# Patient Record
Sex: Male | Born: 1950
Health system: Southern US, Community
[De-identification: ages and names within clinical notes are randomized; demographics above are authoritative.]

## PROBLEM LIST (undated history)

## (undated) DIAGNOSIS — E079 Disorder of thyroid, unspecified: Secondary | ICD-10-CM

## (undated) DIAGNOSIS — E7800 Pure hypercholesterolemia, unspecified: Secondary | ICD-10-CM

## (undated) DIAGNOSIS — G4730 Sleep apnea, unspecified: Secondary | ICD-10-CM

## (undated) HISTORY — DX: Sleep apnea, unspecified: G47.30

---

## 1982-02-26 HISTORY — PX: NASAL POLYP SURGERY: SHX186

## 2001-10-21 ENCOUNTER — Ambulatory Visit (HOSPITAL_COMMUNITY): Admission: RE | Admit: 2001-10-21 | Discharge: 2001-10-21 | Payer: Self-pay | Admitting: Gastroenterology

## 2002-04-28 ENCOUNTER — Encounter: Payer: Self-pay | Admitting: Internal Medicine

## 2002-04-28 ENCOUNTER — Ambulatory Visit (HOSPITAL_COMMUNITY): Admission: RE | Admit: 2002-04-28 | Discharge: 2002-04-28 | Payer: Self-pay | Admitting: Internal Medicine

## 2002-05-14 ENCOUNTER — Encounter: Payer: Self-pay | Admitting: Gastroenterology

## 2002-05-14 ENCOUNTER — Encounter: Admission: RE | Admit: 2002-05-14 | Discharge: 2002-05-14 | Payer: Self-pay | Admitting: Gastroenterology

## 2004-11-13 ENCOUNTER — Ambulatory Visit (HOSPITAL_COMMUNITY): Admission: RE | Admit: 2004-11-13 | Discharge: 2004-11-13 | Payer: Self-pay | Admitting: Urology

## 2008-02-27 DIAGNOSIS — G4730 Sleep apnea, unspecified: Secondary | ICD-10-CM

## 2008-02-27 HISTORY — DX: Sleep apnea, unspecified: G47.30

## 2013-11-19 DIAGNOSIS — E7800 Pure hypercholesterolemia, unspecified: Secondary | ICD-10-CM | POA: Diagnosis not present

## 2013-11-19 DIAGNOSIS — G4733 Obstructive sleep apnea (adult) (pediatric): Secondary | ICD-10-CM | POA: Diagnosis not present

## 2013-12-14 DIAGNOSIS — E039 Hypothyroidism, unspecified: Secondary | ICD-10-CM | POA: Diagnosis not present

## 2013-12-14 DIAGNOSIS — Z0000 Encounter for general adult medical examination without abnormal findings: Secondary | ICD-10-CM | POA: Diagnosis not present

## 2013-12-14 DIAGNOSIS — R079 Chest pain, unspecified: Secondary | ICD-10-CM | POA: Diagnosis not present

## 2013-12-14 DIAGNOSIS — E785 Hyperlipidemia, unspecified: Secondary | ICD-10-CM | POA: Diagnosis not present

## 2013-12-14 DIAGNOSIS — Z125 Encounter for screening for malignant neoplasm of prostate: Secondary | ICD-10-CM | POA: Diagnosis not present

## 2013-12-14 DIAGNOSIS — Z23 Encounter for immunization: Secondary | ICD-10-CM | POA: Diagnosis not present

## 2013-12-14 DIAGNOSIS — G4733 Obstructive sleep apnea (adult) (pediatric): Secondary | ICD-10-CM | POA: Diagnosis not present

## 2013-12-22 DIAGNOSIS — I739 Peripheral vascular disease, unspecified: Secondary | ICD-10-CM | POA: Diagnosis not present

## 2013-12-22 DIAGNOSIS — I714 Abdominal aortic aneurysm, without rupture: Secondary | ICD-10-CM | POA: Diagnosis not present

## 2014-01-05 DIAGNOSIS — R0789 Other chest pain: Secondary | ICD-10-CM | POA: Diagnosis not present

## 2014-01-05 DIAGNOSIS — E782 Mixed hyperlipidemia: Secondary | ICD-10-CM | POA: Diagnosis not present

## 2014-01-18 DIAGNOSIS — R0789 Other chest pain: Secondary | ICD-10-CM | POA: Diagnosis not present

## 2014-01-18 DIAGNOSIS — E782 Mixed hyperlipidemia: Secondary | ICD-10-CM | POA: Diagnosis not present

## 2014-01-19 DIAGNOSIS — Z79899 Other long term (current) drug therapy: Secondary | ICD-10-CM | POA: Diagnosis not present

## 2014-02-02 DIAGNOSIS — R202 Paresthesia of skin: Secondary | ICD-10-CM | POA: Diagnosis not present

## 2014-02-02 DIAGNOSIS — R21 Rash and other nonspecific skin eruption: Secondary | ICD-10-CM | POA: Diagnosis not present

## 2014-02-02 DIAGNOSIS — B351 Tinea unguium: Secondary | ICD-10-CM | POA: Diagnosis not present

## 2014-03-02 DIAGNOSIS — E039 Hypothyroidism, unspecified: Secondary | ICD-10-CM | POA: Diagnosis not present

## 2014-06-17 DIAGNOSIS — Z79899 Other long term (current) drug therapy: Secondary | ICD-10-CM | POA: Diagnosis not present

## 2014-06-17 DIAGNOSIS — R7309 Other abnormal glucose: Secondary | ICD-10-CM | POA: Diagnosis not present

## 2014-06-17 DIAGNOSIS — E039 Hypothyroidism, unspecified: Secondary | ICD-10-CM | POA: Diagnosis not present

## 2014-06-17 DIAGNOSIS — E559 Vitamin D deficiency, unspecified: Secondary | ICD-10-CM | POA: Diagnosis not present

## 2014-06-17 DIAGNOSIS — E780 Pure hypercholesterolemia: Secondary | ICD-10-CM | POA: Diagnosis not present

## 2014-10-12 DIAGNOSIS — L821 Other seborrheic keratosis: Secondary | ICD-10-CM | POA: Diagnosis not present

## 2014-10-12 DIAGNOSIS — D485 Neoplasm of uncertain behavior of skin: Secondary | ICD-10-CM | POA: Diagnosis not present

## 2014-10-12 DIAGNOSIS — L570 Actinic keratosis: Secondary | ICD-10-CM | POA: Diagnosis not present

## 2014-10-12 DIAGNOSIS — D1801 Hemangioma of skin and subcutaneous tissue: Secondary | ICD-10-CM | POA: Diagnosis not present

## 2014-10-18 DIAGNOSIS — L570 Actinic keratosis: Secondary | ICD-10-CM | POA: Diagnosis not present

## 2015-01-03 DIAGNOSIS — R7309 Other abnormal glucose: Secondary | ICD-10-CM | POA: Diagnosis not present

## 2015-01-03 DIAGNOSIS — Z0000 Encounter for general adult medical examination without abnormal findings: Secondary | ICD-10-CM | POA: Diagnosis not present

## 2015-01-03 DIAGNOSIS — E785 Hyperlipidemia, unspecified: Secondary | ICD-10-CM | POA: Diagnosis not present

## 2015-01-03 DIAGNOSIS — E559 Vitamin D deficiency, unspecified: Secondary | ICD-10-CM | POA: Diagnosis not present

## 2015-01-03 DIAGNOSIS — E039 Hypothyroidism, unspecified: Secondary | ICD-10-CM | POA: Diagnosis not present

## 2015-01-10 DIAGNOSIS — E785 Hyperlipidemia, unspecified: Secondary | ICD-10-CM | POA: Diagnosis not present

## 2015-01-10 DIAGNOSIS — E039 Hypothyroidism, unspecified: Secondary | ICD-10-CM | POA: Diagnosis not present

## 2015-01-10 DIAGNOSIS — H900 Conductive hearing loss, bilateral: Secondary | ICD-10-CM | POA: Diagnosis not present

## 2015-01-10 DIAGNOSIS — Z6828 Body mass index (BMI) 28.0-28.9, adult: Secondary | ICD-10-CM | POA: Diagnosis not present

## 2015-01-10 DIAGNOSIS — Z23 Encounter for immunization: Secondary | ICD-10-CM | POA: Diagnosis not present

## 2015-01-10 DIAGNOSIS — Z0000 Encounter for general adult medical examination without abnormal findings: Secondary | ICD-10-CM | POA: Diagnosis not present

## 2015-07-13 DIAGNOSIS — R7309 Other abnormal glucose: Secondary | ICD-10-CM | POA: Diagnosis not present

## 2015-07-13 DIAGNOSIS — E785 Hyperlipidemia, unspecified: Secondary | ICD-10-CM | POA: Diagnosis not present

## 2015-07-13 DIAGNOSIS — E039 Hypothyroidism, unspecified: Secondary | ICD-10-CM | POA: Diagnosis not present

## 2015-07-13 DIAGNOSIS — M25552 Pain in left hip: Secondary | ICD-10-CM | POA: Diagnosis not present

## 2015-07-13 DIAGNOSIS — R351 Nocturia: Secondary | ICD-10-CM | POA: Diagnosis not present

## 2015-07-13 DIAGNOSIS — Z79899 Other long term (current) drug therapy: Secondary | ICD-10-CM | POA: Diagnosis not present

## 2015-10-27 DIAGNOSIS — S0093XA Contusion of unspecified part of head, initial encounter: Secondary | ICD-10-CM | POA: Diagnosis not present

## 2015-10-27 DIAGNOSIS — M25552 Pain in left hip: Secondary | ICD-10-CM | POA: Diagnosis not present

## 2015-10-27 DIAGNOSIS — S0990XA Unspecified injury of head, initial encounter: Secondary | ICD-10-CM | POA: Diagnosis not present

## 2016-02-07 DIAGNOSIS — R7309 Other abnormal glucose: Secondary | ICD-10-CM | POA: Diagnosis not present

## 2016-02-07 DIAGNOSIS — E559 Vitamin D deficiency, unspecified: Secondary | ICD-10-CM | POA: Diagnosis not present

## 2016-02-07 DIAGNOSIS — E039 Hypothyroidism, unspecified: Secondary | ICD-10-CM | POA: Diagnosis not present

## 2016-02-14 DIAGNOSIS — Z1389 Encounter for screening for other disorder: Secondary | ICD-10-CM | POA: Diagnosis not present

## 2016-02-14 DIAGNOSIS — Z0000 Encounter for general adult medical examination without abnormal findings: Secondary | ICD-10-CM | POA: Diagnosis not present

## 2016-02-14 DIAGNOSIS — H9190 Unspecified hearing loss, unspecified ear: Secondary | ICD-10-CM | POA: Diagnosis not present

## 2016-02-14 DIAGNOSIS — Z6828 Body mass index (BMI) 28.0-28.9, adult: Secondary | ICD-10-CM | POA: Diagnosis not present

## 2016-02-14 DIAGNOSIS — E781 Pure hyperglyceridemia: Secondary | ICD-10-CM | POA: Diagnosis not present

## 2016-02-14 DIAGNOSIS — Z1212 Encounter for screening for malignant neoplasm of rectum: Secondary | ICD-10-CM | POA: Diagnosis not present

## 2016-02-14 DIAGNOSIS — Z7982 Long term (current) use of aspirin: Secondary | ICD-10-CM | POA: Diagnosis not present

## 2016-04-17 DIAGNOSIS — E039 Hypothyroidism, unspecified: Secondary | ICD-10-CM | POA: Diagnosis not present

## 2016-07-30 DIAGNOSIS — E559 Vitamin D deficiency, unspecified: Secondary | ICD-10-CM | POA: Diagnosis not present

## 2016-07-30 DIAGNOSIS — E039 Hypothyroidism, unspecified: Secondary | ICD-10-CM | POA: Diagnosis not present

## 2016-07-30 DIAGNOSIS — E785 Hyperlipidemia, unspecified: Secondary | ICD-10-CM | POA: Diagnosis not present

## 2016-07-30 DIAGNOSIS — K644 Residual hemorrhoidal skin tags: Secondary | ICD-10-CM | POA: Diagnosis not present

## 2016-07-30 DIAGNOSIS — Z79899 Other long term (current) drug therapy: Secondary | ICD-10-CM | POA: Diagnosis not present

## 2016-07-30 DIAGNOSIS — R7309 Other abnormal glucose: Secondary | ICD-10-CM | POA: Diagnosis not present

## 2016-09-01 ENCOUNTER — Encounter (HOSPITAL_BASED_OUTPATIENT_CLINIC_OR_DEPARTMENT_OTHER): Payer: Self-pay | Admitting: *Deleted

## 2016-09-01 ENCOUNTER — Emergency Department (HOSPITAL_BASED_OUTPATIENT_CLINIC_OR_DEPARTMENT_OTHER)
Admission: EM | Admit: 2016-09-01 | Discharge: 2016-09-01 | Disposition: A | Discharge status: Home / Self Care | Payer: Medicare Other | Attending: Emergency Medicine | Admitting: Emergency Medicine

## 2016-09-01 ENCOUNTER — Emergency Department (HOSPITAL_BASED_OUTPATIENT_CLINIC_OR_DEPARTMENT_OTHER): Payer: Medicare Other

## 2016-09-01 DIAGNOSIS — H6522 Chronic serous otitis media, left ear: Secondary | ICD-10-CM | POA: Diagnosis not present

## 2016-09-01 DIAGNOSIS — H81392 Other peripheral vertigo, left ear: Secondary | ICD-10-CM | POA: Diagnosis not present

## 2016-09-01 DIAGNOSIS — Z7982 Long term (current) use of aspirin: Secondary | ICD-10-CM | POA: Diagnosis not present

## 2016-09-01 DIAGNOSIS — R251 Tremor, unspecified: Secondary | ICD-10-CM | POA: Diagnosis not present

## 2016-09-01 DIAGNOSIS — R42 Dizziness and giddiness: Secondary | ICD-10-CM | POA: Diagnosis not present

## 2016-09-01 DIAGNOSIS — E039 Hypothyroidism, unspecified: Secondary | ICD-10-CM | POA: Insufficient documentation

## 2016-09-01 DIAGNOSIS — H65492 Other chronic nonsuppurative otitis media, left ear: Secondary | ICD-10-CM | POA: Diagnosis not present

## 2016-09-01 DIAGNOSIS — I6789 Other cerebrovascular disease: Secondary | ICD-10-CM | POA: Diagnosis not present

## 2016-09-01 HISTORY — DX: Disorder of thyroid, unspecified: E07.9

## 2016-09-01 HISTORY — DX: Pure hypercholesterolemia, unspecified: E78.00

## 2016-09-01 LAB — BASIC METABOLIC PANEL
Anion gap: 7 (ref 5–15)
BUN: 22 mg/dL — AB (ref 6–20)
CO2: 26 mmol/L (ref 22–32)
CREATININE: 1.08 mg/dL (ref 0.61–1.24)
Calcium: 8.9 mg/dL (ref 8.9–10.3)
Chloride: 105 mmol/L (ref 101–111)
GFR calc Af Amer: >60 mL/min (ref >60)
Glucose, Bld: 149 mg/dL — ABNORMAL HIGH (ref 65–99)
POTASSIUM: 3.7 mmol/L (ref 3.5–5.1)
SODIUM: 138 mmol/L (ref 135–145)

## 2016-09-01 LAB — CBC WITH DIFFERENTIAL/PLATELET
Basophils Absolute: 0 10*3/uL (ref 0.0–0.1)
Basophils Relative: 0 %
EOS ABS: 0.1 10*3/uL (ref 0.0–0.7)
EOS PCT: 2 %
HCT: 39.6 % (ref 39.0–52.0)
Hemoglobin: 14.2 g/dL (ref 13.0–17.0)
LYMPHS ABS: 2.2 10*3/uL (ref 0.7–4.0)
Lymphocytes Relative: 48 %
MCH: 34.8 pg — AB (ref 26.0–34.0)
MCHC: 35.9 g/dL (ref 30.0–36.0)
MCV: 97.1 fL (ref 78.0–100.0)
Monocytes Absolute: 0.4 10*3/uL (ref 0.1–1.0)
Monocytes Relative: 9 %
Neutro Abs: 1.9 10*3/uL (ref 1.7–7.7)
Neutrophils Relative %: 41 %
PLATELETS: 154 10*3/uL (ref 150–400)
RBC: 4.08 MIL/uL — AB (ref 4.22–5.81)
RDW: 12.6 % (ref 11.5–15.5)
WBC: 4.6 10*3/uL (ref 4.0–10.5)

## 2016-09-01 LAB — TROPONIN I

## 2016-09-01 MED ORDER — AMOXICILLIN-POT CLAVULANATE 875-125 MG PO TABS: 1.0000 | ORAL_TABLET | Freq: Two times a day (BID) | ORAL | 0 refills | Status: AC

## 2016-09-01 MED ORDER — PREDNISONE 20 MG PO TABS: 60.0000 mg | ORAL_TABLET | Freq: Every day | ORAL | 0 refills | Status: AC

## 2016-09-01 MED ORDER — SODIUM CHLORIDE 0.9 % IV BOLUS (SEPSIS)
1000.0000 mL | Freq: Once | INTRAVENOUS | Status: AC
Start: 2016-09-01 — End: 2016-09-01
  Administered 2016-09-01: 1000 mL via INTRAVENOUS

## 2016-09-01 MED ORDER — MECLIZINE HCL 25 MG PO TABS
25.0000 mg | ORAL_TABLET | Freq: Once | ORAL | Status: AC
  Administered 2016-09-01: 25 mg via ORAL
  Filled 2016-09-01: qty 1

## 2016-09-01 MED ORDER — ONDANSETRON HCL 4 MG/2ML IJ SOLN
4.0000 mg | Freq: Once | INTRAMUSCULAR | Status: AC
  Administered 2016-09-01: 4 mg via INTRAVENOUS
  Filled 2016-09-01: qty 2

## 2016-09-01 MED ORDER — PREDNISONE 50 MG PO TABS
60.0000 mg | ORAL_TABLET | Freq: Once | ORAL | Status: AC
  Administered 2016-09-01: 03:00:00 60 mg via ORAL
  Filled 2016-09-01: qty 1

## 2016-09-01 MED ORDER — DIAZEPAM 5 MG PO TABS: 5.0000 mg | ORAL_TABLET | Freq: Two times a day (BID) | ORAL | 0 refills | Status: DC | PRN

## 2016-09-01 MED ORDER — DIAZEPAM 5 MG/ML IJ SOLN
5.0000 mg | Freq: Once | INTRAMUSCULAR | Status: AC
  Administered 2016-09-01: 5 mg via INTRAVENOUS
  Filled 2016-09-01: qty 2

## 2016-09-01 MED ORDER — MECLIZINE HCL 25 MG PO TABS: 25.0000 mg | ORAL_TABLET | Freq: Three times a day (TID) | ORAL | 0 refills | Status: DC | PRN

## 2016-09-01 NOTE — ED Notes (Signed)
Pt states he feels much better.  He is still having some dizziness, but he is ambulatory without assistance.

## 2016-09-01 NOTE — Discharge Instructions (Signed)
-   Be very careful when going from sitting to standing.  - Take meclizine 2-3 times daily for the next several days. Use the Valium as needed for severe dizziness that is not controlled by the meclizine.  - Drink plenty of fluids.  - If you develop difficulty speaking, swallowing, numbness, weakness, or other new or concerning symptoms, return to the ER immediately

## 2016-09-01 NOTE — ED Triage Notes (Signed)
Pt woke this am to urinate and began to feel hot sweaty nauseated and dizzy. Pt denies CP or SHOB and states that dizziness worsens with movement

## 2016-09-01 NOTE — ED Notes (Signed)
Pt verbalizes understanding of d/c instructions and denies any further needs at this time. 

## 2016-09-01 NOTE — ED Provider Notes (Signed)
[facility] Provider Note   CSN: [id number] Arrival date & time: 09/01/16  0141     History   Chief Complaint Chief Complaint  Patient presents with  . Dizziness    HPI Scott Hinton is a 66 y.o. male.  HPI   66 yo M with h/o HLD, hypothyroidism here with dizziness. Pt states that over the past several days, he has a mild pressure in his left ear with tinnitus yesterday. He felt well going to bed however and awoke to go to the restroom just prior to arrival. He stood up then experienced acute onset of dizziness. He describes it as a sensation that the room was spinning around him. He had nausea but no vomiting. Since then, he has had extreme dizziness when moving his head or standing up. No recent med changes. Sx improve and resolve when he keeps his head still. No associated weakness, numbness, difficulty swallowing, or other complaints. No recent med changes.  Past Medical History:  Diagnosis Date  . Hypercholesterolemia   . Thyroid disease     There are no active problems to display for this patient.   History reviewed. No pertinent surgical history.     Home Medications    Prior to Admission medications   Medication Sig Start Date End Date Taking? Authorizing Provider  aspirin 81 MG chewable tablet Chew 81 mg by mouth daily.   Yes [provider]  atorvastatin (LIPITOR) 10 MG tablet Take 10 mg by mouth daily.   Yes [provider]  cholecalciferol (VITAMIN D) 1000 units tablet Take 2,000 Units by mouth daily.   Yes [provider]  fenofibrate (TRICOR) 48 MG tablet Take 40 mg by mouth daily.   Yes [provider]  levothyroxine (SYNTHROID, LEVOTHROID) 100 MCG tablet Take 188 mcg by mouth daily before breakfast.   Yes [provider]  Multiple Vitamin (MULTIVITAMIN) tablet Take 1 tablet by mouth daily.   Yes [provider]  omeprazole (PRILOSEC) 40 MG capsule Take 40 mg by mouth daily.   Yes [provider]  amoxicillin-clavulanate (AUGMENTIN) 875-125 MG tablet Take 1 tablet by mouth every 12 (twelve) hours. 09/01/16 09/11/16  Duffy Bruce, MD  diazepam (VALIUM) 5 MG tablet Take 1 tablet (5 mg total) by mouth every 12 (twelve) hours as needed (severe dizziness). 09/01/16   Duffy Bruce, MD  meclizine (ANTIVERT) 25 MG tablet Take 1 tablet (25 mg total) by mouth 3 (three) times daily as needed for dizziness (mild dizziness). 09/01/16   Duffy Bruce, MD  predniSONE (DELTASONE) 20 MG tablet Take 3 tablets (60 mg total) by mouth daily. 09/01/16 09/06/16  Duffy Bruce, MD    Family History History reviewed. No pertinent family history.  Social History Social History  Substance Use Topics  . Smoking status: Never Smoker  . Smokeless tobacco: Never Used  . Alcohol use No     Allergies   Patient has no allergy information on record.   Review of Systems Review of Systems  Gastrointestinal: Positive for nausea.  Musculoskeletal: Positive for gait problem.  Neurological: Positive for dizziness and light-headedness.  All other systems reviewed and are negative.    Physical Exam Updated Vital Signs BP 120/67   Pulse (!) 55   Temp 97.9 F (36.6 C) (Oral)   Resp 17   Ht 5\' 8"  (1.727 m)   Wt 80.7 kg (178 lb)   SpO2 97%   BMI 27.06 kg/m   Physical Exam  Constitutional: He is  oriented to person, place, and time. He appears well-developed and well-nourished. No distress.  HENT:  Head: Normocephalic and atraumatic.  Nasal mucosal edema on left. Serous effusion noted on left, with no TM erythema. No drainage.  Eyes: Conjunctivae are normal.  Neck: Neck supple.  Cardiovascular: Normal rate, regular rhythm and normal heart sounds.  Exam reveals no friction rub.   No murmur heard. Pulmonary/Chest: Effort normal and breath sounds normal. No respiratory distress. He has no wheezes. He has no rales.  Abdominal: He exhibits no distension.  Musculoskeletal: He exhibits no  edema.  Neurological: He is alert and oriented to person, place, and time. He exhibits normal muscle tone.  Skin: Skin is warm. Capillary refill takes less than 2 seconds.  Psychiatric: He has a normal mood and affect.  Nursing note and vitals reviewed.   Neurological Exam:  Mental Status: Alert and oriented to person, place, and time. Attention and concentration normal. Speech clear. Recent memory is intact. Cranial Nerves: Visual fields grossly intact. EOMI and PERRLA. No nystagmus noted. Facial sensation intact at forehead, maxillary cheek, and chin/mandible bilaterally. No facial asymmetry or weakness. Hearing grossly normal. Uvula is midline, and palate elevates symmetrically. Normal SCM and trapezius strength. Tongue midline without fasciculations. Motor: Muscle strength 5/5 in proximal and distal UE and LE bilaterally. No pronator drift. Muscle tone normal. Reflexes: 2+ and symmetrical in all four extremities.  Sensation: Intact to light touch in upper and lower extremities distally bilaterally.  Gait: Normal without ataxia. Coordination: Normal FTN bilaterally.     ED Treatments / Results  Labs (all labs ordered are listed, but only abnormal results are displayed) Labs Reviewed  CBC WITH DIFFERENTIAL/PLATELET - Abnormal; Notable for the following:       Result Value   RBC 4.08 (*)    MCH 34.8 (*)    All other components within normal limits  BASIC METABOLIC PANEL - Abnormal; Notable for the following:    Glucose, Bld 149 (*)    BUN 22 (*)    All other components within normal limits  TROPONIN I    EKG  EKG Interpretation  Date/Time:  Saturday September 01 2016 01:55:18 EDT Ventricular Rate:  53 PR Interval:    QRS Duration: 99 QT Interval:  430 QTC Calculation: 404 R Axis:   -30 Text Interpretation:  Sinus rhythm Left axis deviation Abnormal R-wave progression, early transition No old tracing to compare Confirmed by Duffy Bruce [phone]) on 09/01/2016 2:14:05 AM         Radiology Ct Head Wo Contrast  Result Date: 09/01/2016 CLINICAL DATA:  Awoke with vertigo. EXAM: CT HEAD WITHOUT CONTRAST TECHNIQUE: Contiguous axial images were obtained from the base of the skull through the vertex without intravenous contrast. COMPARISON:  10/27/2015 FINDINGS: Brain: No evidence of acute infarction, hemorrhage, hydrocephalus, extra-axial collection or mass lesion/mass effect. Brain volume is normal for age. Vascular: No hyperdense vessel or unexpected calcification. Skull: Normal. Negative for fracture or focal lesion. Sinuses/Orbits: Paranasal sinuses and mastoid air cells are clear. The visualized orbits are unremarkable. Other: None pain IMPRESSION: No acute intracranial abnormality. Electronically Signed   By: Jeb Levering M.D.   On: 09/01/2016 02:46    Procedures Procedures (including critical care time)  Medications Ordered in ED Medications  diazepam (VALIUM) injection 5 mg (5 mg Intravenous Given 09/01/16 0226)  sodium chloride 0.9 % bolus 1,000 mL (0 mLs Intravenous Stopped 09/01/16 0306)  ondansetron (ZOFRAN) injection 4 mg (4 mg Intravenous Given 09/01/16 0224)  predniSONE (  DELTASONE) tablet 60 mg (60 mg Oral Given 09/01/16 0306)  meclizine (ANTIVERT) tablet 25 mg (25 mg Oral Given 09/01/16 0306)     Initial Impression / Assessment and Plan / ED Course  I have reviewed the triage vital signs and the nursing notes.  Pertinent labs & imaging results that were available during my care of the patient were reviewed by me and considered in my medical decision making (see chart for details).     66 yo M here with positional vertigo in setting of several days of worsening nasal congestion and L ear fullness/tinnitus. Suspect peripheral vertigo 2/2 his sinusitis/serous OM, versus BPPV versus Meniere's. He has a long, extensive h/o recurrent L ear infections and sinus infections predisposing him to this. He has NO dysarthria, dysphagia, no weakness, no numbness, no sx at  rest/when still and sx are NOT c/w central etiology such as CVA, basilar artery occlusion/spasm, or other CNS abnormality. CT head is negative. Vertigo is unidirectional, not present at rest, reproducible on dix-hallpike with + latency, c/w peripheral etiology. He had no preceding/subsequent CP, no actual syncope/presyncope and EKG non-ischemic, labs reassuring - doubt arrhythmia, ACS, or PE. Pt feels markedly improved with valium, meclizine in ED. Will tx with antivert/valium at home with course of Augmentin/Prednisone for sinusitis/AOM. Pt in agreement. Referred to ENT.  Final Clinical Impressions(s) / ED Diagnoses   Final diagnoses:  Peripheral vertigo involving left ear  Left chronic serous otitis media    New Prescriptions New Prescriptions   AMOXICILLIN-CLAVULANATE (AUGMENTIN) 875-125 MG TABLET    Take 1 tablet by mouth every 12 (twelve) hours.   DIAZEPAM (VALIUM) 5 MG TABLET    Take 1 tablet (5 mg total) by mouth every 12 (twelve) hours as needed (severe dizziness).   MECLIZINE (ANTIVERT) 25 MG TABLET    Take 1 tablet (25 mg total) by mouth 3 (three) times daily as needed for dizziness (mild dizziness).   PREDNISONE (DELTASONE) 20 MG TABLET    Take 3 tablets (60 mg total) by mouth daily.     Duffy Bruce, MD 09/01/16 [phone]

## 2016-09-06 DIAGNOSIS — H81392 Other peripheral vertigo, left ear: Secondary | ICD-10-CM | POA: Diagnosis not present

## 2016-09-06 DIAGNOSIS — H9312 Tinnitus, left ear: Secondary | ICD-10-CM | POA: Diagnosis not present

## 2016-09-06 DIAGNOSIS — Z09 Encounter for follow-up examination after completed treatment for conditions other than malignant neoplasm: Secondary | ICD-10-CM | POA: Diagnosis not present

## 2016-09-28 DIAGNOSIS — H838X3 Other specified diseases of inner ear, bilateral: Secondary | ICD-10-CM | POA: Diagnosis not present

## 2016-09-28 DIAGNOSIS — H903 Sensorineural hearing loss, bilateral: Secondary | ICD-10-CM | POA: Diagnosis not present

## 2016-09-28 DIAGNOSIS — H8102 Meniere's disease, left ear: Secondary | ICD-10-CM | POA: Diagnosis not present

## 2016-09-28 DIAGNOSIS — H9313 Tinnitus, bilateral: Secondary | ICD-10-CM | POA: Diagnosis not present

## 2016-09-28 DIAGNOSIS — R42 Dizziness and giddiness: Secondary | ICD-10-CM | POA: Diagnosis not present

## 2017-01-27 ENCOUNTER — Emergency Department (HOSPITAL_BASED_OUTPATIENT_CLINIC_OR_DEPARTMENT_OTHER): Payer: Medicare Other

## 2017-01-27 ENCOUNTER — Other Ambulatory Visit: Payer: Self-pay

## 2017-01-27 ENCOUNTER — Emergency Department (HOSPITAL_BASED_OUTPATIENT_CLINIC_OR_DEPARTMENT_OTHER)
Admission: EM | Admit: 2017-01-27 | Discharge: 2017-01-27 | Disposition: A | Discharge status: Home / Self Care | Payer: Medicare Other | Attending: Emergency Medicine | Admitting: Emergency Medicine

## 2017-01-27 ENCOUNTER — Encounter (HOSPITAL_BASED_OUTPATIENT_CLINIC_OR_DEPARTMENT_OTHER): Payer: Self-pay | Admitting: Emergency Medicine

## 2017-01-27 DIAGNOSIS — Z7982 Long term (current) use of aspirin: Secondary | ICD-10-CM | POA: Diagnosis not present

## 2017-01-27 DIAGNOSIS — E079 Disorder of thyroid, unspecified: Secondary | ICD-10-CM | POA: Insufficient documentation

## 2017-01-27 DIAGNOSIS — R079 Chest pain, unspecified: Secondary | ICD-10-CM | POA: Insufficient documentation

## 2017-01-27 DIAGNOSIS — R110 Nausea: Secondary | ICD-10-CM | POA: Insufficient documentation

## 2017-01-27 DIAGNOSIS — R918 Other nonspecific abnormal finding of lung field: Secondary | ICD-10-CM | POA: Diagnosis not present

## 2017-01-27 DIAGNOSIS — M79632 Pain in left forearm: Secondary | ICD-10-CM | POA: Diagnosis not present

## 2017-01-27 DIAGNOSIS — Z79899 Other long term (current) drug therapy: Secondary | ICD-10-CM | POA: Diagnosis not present

## 2017-01-27 LAB — BASIC METABOLIC PANEL
ANION GAP: 6 (ref 5–15)
BUN: 20 mg/dL (ref 6–20)
CO2: 24 mmol/L (ref 22–32)
CREATININE: 1.13 mg/dL (ref 0.61–1.24)
Calcium: 9.8 mg/dL (ref 8.9–10.3)
Chloride: 108 mmol/L (ref 101–111)
GFR calc non Af Amer: >60 mL/min (ref >60)
Glucose, Bld: 104 mg/dL — ABNORMAL HIGH (ref 65–99)
Potassium: 4.1 mmol/L (ref 3.5–5.1)
Sodium: 138 mmol/L (ref 135–145)

## 2017-01-27 LAB — TROPONIN I: Troponin I: <0.03 ng/mL (ref <0.03)

## 2017-01-27 LAB — CBC
HCT: 46.3 % (ref 39.0–52.0)
HEMOGLOBIN: 16.3 g/dL (ref 13.0–17.0)
MCH: 33.9 pg (ref 26.0–34.0)
MCHC: 35.2 g/dL (ref 30.0–36.0)
MCV: 96.3 fL (ref 78.0–100.0)
Platelets: 172 10*3/uL (ref 150–400)
RBC: 4.81 MIL/uL (ref 4.22–5.81)
RDW: 12.5 % (ref 11.5–15.5)
WBC: 6.3 10*3/uL (ref 4.0–10.5)

## 2017-01-27 MED ORDER — PREDNISONE 20 MG PO TABS: 40.0000 mg | ORAL_TABLET | Freq: Every day | ORAL | 0 refills | Status: DC

## 2017-01-27 NOTE — ED Triage Notes (Signed)
Patient states that he has had pain to his left arm and shoulder x 1 week. Reports that he has had pain so bad that he was nauseated last night

## 2017-01-27 NOTE — ED Provider Notes (Signed)
[facility]PART[facility] Provider Note   CSN: [id number] Arrival date & time: 01/27/17  1309     History   Chief Complaint Chief Complaint  Patient presents with  . Shoulder Pain  . Chest Pain    HPI Scott Hinton is a 66 y.o. male.  HPI Patient presents with left forearm pain.  Had for the last week.  Slight radiation up the arm.  Worse with laying on left or right side.  It is better with walking and worse with sitting down.  Pain is gotten severe and had some nausea with it.  He has taken 2 Motrin pills and has some relief of the pain.  No trouble breathing.  No swelling in her legs or forearm.  No trauma.  He really has not had pain like this before the last week. Past Medical History:  Diagnosis Date  . Hypercholesterolemia   . Thyroid disease     There are no active problems to display for this patient.   History reviewed. No pertinent surgical history.     Home Medications    Prior to Admission medications   Medication Sig Start Date End Date Taking? Authorizing Provider  aspirin 81 MG chewable tablet Chew 81 mg by mouth daily.    [provider]  atorvastatin (LIPITOR) 10 MG tablet Take 10 mg by mouth daily.    [provider]  cholecalciferol (VITAMIN D) 1000 units tablet Take 2,000 Units by mouth daily.    [provider]  diazepam (VALIUM) 5 MG tablet Take 1 tablet (5 mg total) by mouth every 12 (twelve) hours as needed (severe dizziness). 09/01/16   Duffy Bruce, MD  fenofibrate (TRICOR) 48 MG tablet Take 40 mg by mouth daily.    [provider]  levothyroxine (SYNTHROID, LEVOTHROID) 100 MCG tablet Take 188 mcg by mouth daily before breakfast.    [provider]  meclizine (ANTIVERT) 25 MG tablet Take 1 tablet (25 mg total) by mouth 3 (three) times daily as needed for dizziness (mild dizziness). 09/01/16   Duffy Bruce, MD  Multiple Vitamin (MULTIVITAMIN) tablet Take 1 tablet by mouth daily.     [provider]  omeprazole (PRILOSEC) 40 MG capsule Take 40 mg by mouth daily.    [provider]  predniSONE (DELTASONE) 20 MG tablet Take 2 tablets (40 mg total) by mouth daily. 01/27/17   Davonna Belling, MD    Family History History reviewed. No pertinent family history.  Social History Social History   Tobacco Use  . Smoking status: Never Smoker  . Smokeless tobacco: Never Used  Substance Use Topics  . Alcohol use: No  . Drug use: No     Allergies   Patient has no known allergies.   Review of Systems Review of Systems  Constitutional: Negative for appetite change.  HENT: Negative for congestion.   Respiratory: Negative for shortness of breath.   Cardiovascular: Positive for chest pain.  Gastrointestinal: Negative for abdominal distention.  Genitourinary: Negative for flank pain.  Musculoskeletal: Negative for back pain.       Left forearm pain  Skin: Negative for rash.  Neurological: Negative for weakness and numbness.  Psychiatric/Behavioral: Negative for confusion.     Physical Exam Updated Vital Signs BP 113/82   Pulse (!) 55   Temp 98.5 F (36.9 C) (Oral)   Resp 14   Ht 5\' 7"  (1.702 m)   Wt 81.6 kg (180 lb)   SpO2 98%   BMI 28.19  kg/m   Physical Exam  Constitutional: He appears well-developed.  HENT:  Head: Normocephalic.  Eyes: Pupils are equal, round, and reactive to light.  Neck: Neck supple.  Cardiovascular: Normal rate and regular rhythm.  Pulmonary/Chest: Effort normal.  Abdominal: Soft.  Musculoskeletal: Normal range of motion.  No tenderness to left forearm.  Neurovascular intact in left upper extremity.  Good radial median and ulnar distribution sensation and strength in the left hand.  Good flexion and extension at the wrist and elbow.  Good range of motion of the shoulder.  Strong radial pulse.  No skin changes.  No cervical spine or shoulder tenderness.  Neurological: He is alert.  Skin: Skin is warm. Capillary  refill takes less than 2 seconds.  Psychiatric: He has a normal mood and affect.  Nursing note and vitals reviewed.    ED Treatments / Results  Labs (all labs ordered are listed, but only abnormal results are displayed) Labs Reviewed  BASIC METABOLIC PANEL - Abnormal; Notable for the following components:      Result Value   Glucose, Bld 104 (*)    All other components within normal limits  CBC  TROPONIN I    EKG  EKG Interpretation  Date/Time:  Sunday January 27 2017 13:14:06 EST Ventricular Rate:  79 PR Interval:  182 QRS Duration: 92 QT Interval:  370 QTC Calculation: 424 R Axis:   -43 Text Interpretation:  Normal sinus rhythm with sinus arrhythmia Left axis deviation Nonspecific ST abnormality Abnormal ECG Confirmed by Aniayah Alaniz ([id number]) on 01/27/2017 4:47:07 PM       Radiology Dg Chest 2 View  Result Date: 01/27/2017 CLINICAL DATA:  Left arm and shoulder pain. EXAM: CHEST  2 VIEW COMPARISON:  09/13/2004 FINDINGS: Mild hyperinflation. Heart and mediastinal contours are within normal limits. No focal opacities or effusions. No acute bony abnormality. IMPRESSION: Hyperinflation.  No active cardiopulmonary disease. Electronically Signed   By: Kevin  Dover M.D.   On: 01/27/2017 15:37   Dg Forearm Left  Result Date: 01/27/2017 CLINICAL DATA:  Left forearm pain.  No known injury. EXAM: LEFT FOREARM - 2 VIEW COMPARISON:  None. FINDINGS: There is no evidence of fracture or other focal bone lesions. Soft tissues are unremarkable. IMPRESSION: Negative. Electronically Signed   By: John  Stahl M.D.   On: 01/27/2017 17:42    Procedures Procedures (including critical care time)  Medications Ordered in ED Medications - No data to display   Initial Impression / Assessment and Plan / ED Course  I have reviewed the triage vital signs and the nursing notes.  Pertinent labs & imaging results that were available during my care of the patient were reviewed by me and  considered in my medical decision making (see chart for details).     Patient with forearm pain.  Had for the last week.  Rather benign exam.  Good movement at shoulder.  Benign neuro exam.  Good vascular exam.  Has had shingles in the past but no rash at this time.  Also not tender.  Will give 3-day course of steroids for the anti-inflammatory and see if it helps.  Follow-up with sports medicine as needed.  Final Clinical Impressions(s) / ED Diagnoses   Final diagnoses:  Pain of left forearm    ED Discharge Orders        Ordered    predniSONE (DELTASONE) 20 MG tablet  Daily     12 /02/18 1830       Davonna Belling, MD 01/27/17 [phone]

## 2017-03-02 DIAGNOSIS — E559 Vitamin D deficiency, unspecified: Secondary | ICD-10-CM | POA: Diagnosis not present

## 2017-03-02 DIAGNOSIS — R42 Dizziness and giddiness: Secondary | ICD-10-CM | POA: Diagnosis not present

## 2017-03-02 DIAGNOSIS — E039 Hypothyroidism, unspecified: Secondary | ICD-10-CM | POA: Diagnosis not present

## 2017-03-02 DIAGNOSIS — R202 Paresthesia of skin: Secondary | ICD-10-CM | POA: Diagnosis not present

## 2017-03-02 DIAGNOSIS — R351 Nocturia: Secondary | ICD-10-CM | POA: Diagnosis not present

## 2017-03-02 DIAGNOSIS — M25512 Pain in left shoulder: Secondary | ICD-10-CM | POA: Diagnosis not present

## 2017-03-02 DIAGNOSIS — H8109 Meniere's disease, unspecified ear: Secondary | ICD-10-CM | POA: Diagnosis not present

## 2017-03-19 DIAGNOSIS — M79602 Pain in left arm: Secondary | ICD-10-CM | POA: Diagnosis not present

## 2017-03-19 DIAGNOSIS — M5412 Radiculopathy, cervical region: Secondary | ICD-10-CM | POA: Diagnosis not present

## 2017-03-21 DIAGNOSIS — M79602 Pain in left arm: Secondary | ICD-10-CM | POA: Diagnosis not present

## 2017-03-21 DIAGNOSIS — M5412 Radiculopathy, cervical region: Secondary | ICD-10-CM | POA: Diagnosis not present

## 2017-03-22 DIAGNOSIS — Z0000 Encounter for general adult medical examination without abnormal findings: Secondary | ICD-10-CM | POA: Diagnosis not present

## 2017-09-03 DIAGNOSIS — E039 Hypothyroidism, unspecified: Secondary | ICD-10-CM | POA: Diagnosis not present

## 2017-09-03 DIAGNOSIS — M2560 Stiffness of unspecified joint, not elsewhere classified: Secondary | ICD-10-CM | POA: Diagnosis not present

## 2017-09-03 DIAGNOSIS — E785 Hyperlipidemia, unspecified: Secondary | ICD-10-CM | POA: Diagnosis not present

## 2017-09-03 DIAGNOSIS — R001 Bradycardia, unspecified: Secondary | ICD-10-CM | POA: Diagnosis not present

## 2017-10-17 DIAGNOSIS — H8109 Meniere's disease, unspecified ear: Secondary | ICD-10-CM | POA: Diagnosis not present

## 2017-10-17 DIAGNOSIS — R51 Headache: Secondary | ICD-10-CM | POA: Diagnosis not present

## 2017-12-12 ENCOUNTER — Other Ambulatory Visit: Payer: Self-pay | Admitting: Internal Medicine

## 2017-12-24 DIAGNOSIS — H8102 Meniere's disease, left ear: Secondary | ICD-10-CM | POA: Diagnosis not present

## 2017-12-24 DIAGNOSIS — H9312 Tinnitus, left ear: Secondary | ICD-10-CM | POA: Diagnosis not present

## 2017-12-24 DIAGNOSIS — H6122 Impacted cerumen, left ear: Secondary | ICD-10-CM | POA: Diagnosis not present

## 2017-12-24 DIAGNOSIS — H838X3 Other specified diseases of inner ear, bilateral: Secondary | ICD-10-CM | POA: Diagnosis not present

## 2017-12-24 DIAGNOSIS — H903 Sensorineural hearing loss, bilateral: Secondary | ICD-10-CM | POA: Diagnosis not present

## 2017-12-30 ENCOUNTER — Other Ambulatory Visit: Payer: Self-pay | Admitting: Internal Medicine

## 2018-01-13 ENCOUNTER — Other Ambulatory Visit: Payer: Self-pay | Admitting: Internal Medicine

## 2018-02-07 ENCOUNTER — Other Ambulatory Visit: Payer: Self-pay | Admitting: Internal Medicine

## 2018-02-11 ENCOUNTER — Other Ambulatory Visit: Payer: Self-pay | Admitting: Internal Medicine

## 2018-02-28 ENCOUNTER — Other Ambulatory Visit: Payer: Self-pay | Admitting: Internal Medicine

## 2018-03-26 ENCOUNTER — Ambulatory Visit (INDEPENDENT_AMBULATORY_CARE_PROVIDER_SITE_OTHER): Payer: Medicare Other

## 2018-03-26 ENCOUNTER — Ambulatory Visit (INDEPENDENT_AMBULATORY_CARE_PROVIDER_SITE_OTHER): Payer: Medicare Other | Admitting: Internal Medicine

## 2018-03-26 ENCOUNTER — Encounter: Payer: Self-pay | Admitting: Internal Medicine

## 2018-03-26 ENCOUNTER — Ambulatory Visit: Payer: Medicare Other | Admitting: Internal Medicine

## 2018-03-26 VITALS — BP 108/64 | HR 51 | Temp 98.4°F | Ht 68.4 in | Wt 184.0 lb

## 2018-03-26 DIAGNOSIS — Z23 Encounter for immunization: Secondary | ICD-10-CM

## 2018-03-26 DIAGNOSIS — E782 Mixed hyperlipidemia: Secondary | ICD-10-CM | POA: Diagnosis not present

## 2018-03-26 DIAGNOSIS — Z0000 Encounter for general adult medical examination without abnormal findings: Secondary | ICD-10-CM

## 2018-03-26 DIAGNOSIS — G4733 Obstructive sleep apnea (adult) (pediatric): Secondary | ICD-10-CM | POA: Insufficient documentation

## 2018-03-26 DIAGNOSIS — Z9989 Dependence on other enabling machines and devices: Secondary | ICD-10-CM | POA: Diagnosis not present

## 2018-03-26 DIAGNOSIS — E039 Hypothyroidism, unspecified: Secondary | ICD-10-CM | POA: Insufficient documentation

## 2018-03-26 DIAGNOSIS — M25512 Pain in left shoulder: Secondary | ICD-10-CM

## 2018-03-26 DIAGNOSIS — K219 Gastro-esophageal reflux disease without esophagitis: Secondary | ICD-10-CM | POA: Insufficient documentation

## 2018-03-26 DIAGNOSIS — G8929 Other chronic pain: Secondary | ICD-10-CM

## 2018-03-26 NOTE — Patient Instructions (Signed)
Shoulder Pain Many things can cause shoulder pain, including:  An injury.  Moving the shoulder in the same way again and again (overuse).  Joint pain (arthritis). Pain can come from:  Swelling and irritation (inflammation) of any part of the shoulder.  An injury to the shoulder joint.  An injury to: ? Tissues that connect muscle to bone (tendons). ? Tissues that connect bones to each other (ligaments). ? Bones. Follow these instructions at home: Watch for changes in your symptoms. Let your doctor know about them. Follow these instructions to help with your pain. If you have a sling:  Wear the sling as told by your doctor. Remove it only as told by your doctor.  Loosen the sling if your fingers: ? Tingle. ? Become numb. ? Turn cold and blue.  Keep the sling clean.  If the sling is not waterproof: ? Do not let it get wet. ? Take the sling off when you shower or bathe. Managing pain, stiffness, and swelling   If told, put ice on the painful area: ? Put ice in a plastic bag. ? Place a towel between your skin and the bag. ? Leave the ice on for 20 minutes, 2-3 times a day. Stop putting ice on if it does not help with the pain.  Squeeze a soft ball or a foam pad as much as possible. This prevents swelling in the shoulder. It also helps to strengthen the arm. General instructions  Take over-the-counter and prescription medicines only as told by your doctor.  Keep all follow-up visits as told by your doctor. This is important. Contact a doctor if:  Your pain gets worse.  Medicine does not help your pain.  You have new pain in your arm, hand, or fingers. Get help right away if:  Your arm, hand, or fingers: ? Tingle. ? Are numb. ? Are swollen. ? Are painful. ? Turn white or blue. Summary  Shoulder pain can be caused by many things. These include injury, moving the shoulder in the same away again and again, and joint pain.  Watch for changes in your symptoms.  Let your doctor know about them.  This condition may be treated with a sling, ice, and pain medicine.  Contact your doctor if the pain gets worse or you have new pain. Get help right away if your arm, hand, or fingers tingle or get numb, swollen, or painful.  Keep all follow-up visits as told by your doctor. This is important. This information is not intended to replace advice given to you by your health care provider. Make sure you discuss any questions you have with your health care provider. Document Released: 08/01/2007 Document Revised: 08/27/2017 Document Reviewed: 08/27/2017 [employer] Interactive Patient Education  2019 [employer]  

## 2018-03-26 NOTE — Progress Notes (Signed)
Subjective:   Scott Hinton is a 68 y.o. male who presents for Medicare Annual/Subsequent preventive examination.  Review of Systems:  n/a Cardiac Risk Factors include: advanced age (>52men, >5 women);male gender;dyslipidemia     Objective:    Vitals: BP 108/64 (BP Location: Left Arm, Patient Position: Sitting)   Pulse (!) 51   Temp 98.4 F (36.9 C) (Oral)   Ht 5' 8.4" (1.737 m)   Wt 184 lb (83.5 kg)   SpO2 96%   BMI 27.65 kg/m   Body mass index is 27.65 kg/m.  Advanced Directives 03/26/2018 01/27/2017 09/01/2016  Does Patient Have a Medical Advance Directive? No No No  Would patient like information on creating a medical advance directive? - No - Patient declined No - Patient declined    Tobacco Social History   Tobacco Use  Smoking Status Never Smoker  Smokeless Tobacco Never Used     Counseling given: Not Answered   Clinical Intake:  Pre-visit preparation completed: Yes  Pain : No/denies pain Pain Score: 0-No pain     Nutritional Status: BMI 25 -29 Overweight Nutritional Risks: None Diabetes: No  How often do you need to have someone help you when you read instructions, pamphlets, or other written materials from your doctor or pharmacy?: 1 - Never What is the last grade level you completed in school?: 2 years of college  Interpreter Needed?: No  Information entered by :: NAllen LPN  Past Medical History:  Diagnosis Date  . Hypercholesterolemia   . Sleep apnea with use of continuous positive airway pressure (CPAP) 2010  . Thyroid disease    History reviewed. No pertinent surgical history. Family History  Problem Relation Age of Onset  . Cancer Mother   . Heart disease Father   . Emphysema Father   . Coronary artery disease Father   . Stroke Sister   . Pneumonia Brother   . Stroke Brother   . Heart disease Sister   . Epilepsy Brother    Social History   Socioeconomic History  . Marital status: Married    Spouse name: Not on file  .  Number of children: Not on file  . Years of education: Not on file  . Highest education level: Not on file  Occupational History  . Occupation: retired  Scientific laboratory technician  . Financial resource strain: Not hard at all  . Food insecurity:    Worry: Never true    Inability: Never true  . Transportation needs:    Medical: No    Non-medical: No  Tobacco Use  . Smoking status: Never Smoker  . Smokeless tobacco: Never Used  Substance and Sexual Activity  . Alcohol use: No  . Drug use: No  . Sexual activity: Not Currently  Lifestyle  . Physical activity:    Days per week: 7 days    Minutes per session: 60 min  . Stress: Not at all  Relationships  . Social connections:    Talks on phone: Not on file    Gets together: Not on file    Attends religious service: Not on file    Active member of club or organization: Not on file    Attends meetings of clubs or organizations: Not on file    Relationship status: Not on file  Other Topics Concern  . Not on file  Social History Narrative  . Not on file    Outpatient Encounter Medications as of 03/26/2018  Medication Sig  . aspirin 81 MG chewable  tablet Chew 81 mg by mouth daily.  Marland Kitchen atorvastatin (LIPITOR) 10 MG tablet TAKE 1 TABLET BY MOUTH EVERY DAY  . cholecalciferol (VITAMIN D) 1000 units tablet Take 2,000 Units by mouth daily.  . fenofibrate (TRICOR) 48 MG tablet Take 40 mg by mouth daily.  Marland Kitchen levothyroxine (SYNTHROID, LEVOTHROID) 100 MCG tablet TAKE 1 TABLET BY MOUTH DAILY (Patient taking differently: Take with 88 mcg)  . levothyroxine (SYNTHROID, LEVOTHROID) 100 MCG tablet TAKE 1 TABLET BY MOUTH DAILY (Patient taking differently: 88 mcg. Take with 151mcg)  . meclizine (ANTIVERT) 25 MG tablet Take 1 tablet (25 mg total) by mouth 3 (three) times daily as needed for dizziness (mild dizziness).  . Multiple Vitamin (MULTIVITAMIN) tablet Take 1 tablet by mouth daily.  Marland Kitchen omeprazole (PRILOSEC) 40 MG capsule Take 40 mg by mouth daily.  . diazepam  (VALIUM) 5 MG tablet Take 1 tablet (5 mg total) by mouth every 12 (twelve) hours as needed (severe dizziness). (Patient not taking: Reported on 03/26/2018)  . predniSONE (DELTASONE) 20 MG tablet Take 2 tablets (40 mg total) by mouth daily. (Patient not taking: Reported on 03/26/2018)   No facility-administered encounter medications on file as of 03/26/2018.     Activities of Daily Living In your present state of health, do you have any difficulty performing the following activities: 03/26/2018  Hearing? Y  Comment wears hearing aides, has menieres disease  Vision? N  Difficulty concentrating or making decisions? N  Walking or climbing stairs? N  Dressing or bathing? N  Doing errands, shopping? N  Preparing Food and eating ? N  Using the Toilet? N  In the past six months, have you accidently leaked urine? N  Do you have problems with loss of bowel control? N  Managing your Medications? N  Managing your Finances? N  Housekeeping or managing your Housekeeping? N  Some recent data might be hidden    Patient Care Team: Glendale Chard, MD as PCP - General (Internal Medicine)   Assessment:   This is a routine wellness examination for [name].  Exercise Activities and Dietary recommendations Current Exercise Habits: Home exercise routine, Type of exercise: walking;strength training/weights;Other - see comments(bowling and golf), Time (Minutes): 60, Frequency (Times/Week): 7, Weekly Exercise (Minutes/Week): 420, Intensity: Moderate  Goals    . Patient Stated (pt-stated)     Continue exercising with total gym and walking. Keep bowling and when weather gets good start golfing.       Fall Risk Fall Risk  03/26/2018  Falls in the past year? 0  Risk for fall due to : Medication side effect   Is the patient's home free of loose throw rugs in walkways, pet beds, electrical cords, etc?   yes      Grab bars in the bathroom? no      Handrails on the stairs?   yes      Adequate lighting?    yes  Timed Get Up and Go Performed: n/a  Depression Screen PHQ 2/9 Scores 03/26/2018  PHQ - 2 Score 0  PHQ- 9 Score 0    Cognitive Function     6CIT Screen 03/26/2018  What Year? 0 points  What month? 0 points  What time? 0 points  Count back from 20 0 points  Months in reverse 0 points  Repeat phrase 0 points  Total Score 0    Immunization History  Administered Date(s) Administered  . Influenza, High Dose Seasonal PF 11/17/2017    Qualifies for Shingles Vaccine?  yes  Screening Tests Health Maintenance  Topic Date Due  . PNA vac Low Risk Adult (1 of 2 - PCV13) 10/07/2013  . COLONOSCOPY  07/31/2022  . TETANUS/TDAP  11/20/2023  . INFLUENZA VACCINE  Completed  . Hepatitis C Screening  Completed   Cancer Screenings: Lung: Low Dose CT Chest recommended if Age 53-80 years, 30 pack-year currently smoking OR have quit w/in 15years. Patient does not qualify. Colorectal: up to date  Additional Screenings:  Hepatitis C Screening:05/07/2012 <0.1      Plan:   Prevnar 13 order sent to pharmacy. Patient wants to continue exercising.  I have personally reviewed and noted the following in the patient's chart:   . Medical and social history . Use of alcohol, tobacco or illicit drugs  . Current medications and supplements . Functional ability and status . Nutritional status . Physical activity . Advanced directives . List of other physicians . Hospitalizations, surgeries, and ER visits in previous 12 months . Vitals . Screenings to include cognitive, depression, and falls . Referrals and appointments  In addition, I have reviewed and discussed with patient certain preventive protocols, quality metrics, and best practice recommendations. A written personalized care plan for preventive services as well as general preventive health recommendations were provided to patient.     Kellie Simmering, LPN  [id number]

## 2018-03-26 NOTE — Patient Instructions (Signed)
Scott Hinton , Thank you for taking time to come for your Medicare Wellness Visit. I appreciate your ongoing commitment to your health goals. Please review the following plan we discussed and let me know if I can assist you in the future.   Screening recommendations/referrals: Colonoscopy: 07/2012 Recommended yearly ophthalmology/optometry visit for glaucoma screening and checkup Recommended yearly dental visit for hygiene and checkup  Vaccinations: Influenza vaccine: 10/2017 Pneumococcal vaccine: 12/2014 Tdap vaccine: 10/2013 Shingles vaccine: discussed    Advanced directives: Advance directive discussed with you today. Even though you declined this today please call our office should you change your mind and we can give you the proper paperwork for you to fill out.   Conditions/risks identified: Overweight  Next appointment: 03/26/2018  Preventive Care 70 Years and Older, Male Preventive care refers to lifestyle choices and visits with your health care provider that can promote health and wellness. What does preventive care include?  A yearly physical exam. This is also called an annual well check.  Dental exams once or twice a year.  Routine eye exams. Ask your health care provider how often you should have your eyes checked.  Personal lifestyle choices, including:  Daily care of your teeth and gums.  Regular physical activity.  Eating a healthy diet.  Avoiding tobacco and drug use.  Limiting alcohol use.  Practicing safe sex.  Taking low doses of aspirin every day.  Taking vitamin and mineral supplements as recommended by your health care provider. What happens during an annual well check? The services and screenings done by your health care provider during your annual well check will depend on your age, overall health, lifestyle risk factors, and family history of disease. Counseling  Your health care provider may ask you questions about your:  Alcohol  use.  Tobacco use.  Drug use.  Emotional well-being.  Home and relationship well-being.  Sexual activity.  Eating habits.  History of falls.  Memory and ability to understand (cognition).  Work and work Statistician. Screening  You may have the following tests or measurements:  Height, weight, and BMI.  Blood pressure.  Lipid and cholesterol levels. These may be checked every 5 years, or more frequently if you are over 70 years old.  Skin check.  Lung cancer screening. You may have this screening every year starting at age 70 if you have a 30-pack-year history of smoking and currently smoke or have quit within the past 15 years.  Fecal occult blood test (FOBT) of the stool. You may have this test every year starting at age 70.  Flexible sigmoidoscopy or colonoscopy. You may have a sigmoidoscopy every 5 years or a colonoscopy every 10 years starting at age 70.  Prostate cancer screening. Recommendations will vary depending on your family history and other risks.  Hepatitis C blood test.  Hepatitis B blood test.  Sexually transmitted disease (STD) testing.  Diabetes screening. This is done by checking your blood sugar (glucose) after you have not eaten for a while (fasting). You may have this done every 1-3 years.  Abdominal aortic aneurysm (AAA) screening. You may need this if you are a current or former smoker.  Osteoporosis. You may be screened starting at age 70 if you are at high risk. Talk with your health care provider about your test results, treatment options, and if necessary, the need for more tests. Vaccines  Your health care provider may recommend certain vaccines, such as:  Influenza vaccine. This is recommended every year.  Tetanus,  diphtheria, and acellular pertussis (Tdap, Td) vaccine. You may need a Td booster every 10 years.  Zoster vaccine. You may need this after age 70.  Pneumococcal 13-valent conjugate (PCV13) vaccine. One dose is  recommended after age 70.  Pneumococcal polysaccharide (PPSV23) vaccine. One dose is recommended after age 70. Talk to your health care provider about which screenings and vaccines you need and how often you need them. This information is not intended to replace advice given to you by your health care provider. Make sure you discuss any questions you have with your health care provider. Document Released: 03/11/2015 Document Revised: 11/02/2015 Document Reviewed: 12/14/2014 Elsevier Interactive Patient Education  2017 [place] Prevention in the Home Falls can cause injuries. They can happen to people of all ages. There are many things you can do to make your home safe and to help prevent falls. What can I do on the outside of my home?  Regularly fix the edges of walkways and driveways and fix any cracks.  Remove anything that might make you trip as you walk through a door, such as a raised step or threshold.  Trim any bushes or trees on the path to your home.  Use bright outdoor lighting.  Clear any walking paths of anything that might make someone trip, such as rocks or tools.  Regularly check to see if handrails are loose or broken. Make sure that both sides of any steps have handrails.  Any raised decks and porches should have guardrails on the edges.  Have any leaves, snow, or ice cleared regularly.  Use sand or salt on walking paths during winter.  Clean up any spills in your garage right away. This includes oil or grease spills. What can I do in the bathroom?  Use night lights.  Install grab bars by the toilet and in the tub and shower. Do not use towel bars as grab bars.  Use non-skid mats or decals in the tub or shower.  If you need to sit down in the shower, use a plastic, non-slip stool.  Keep the floor dry. Clean up any water that spills on the floor as soon as it happens.  Remove soap buildup in the tub or shower regularly.  Attach bath mats  securely with double-sided non-slip rug tape.  Do not have throw rugs and other things on the floor that can make you trip. What can I do in the bedroom?  Use night lights.  Make sure that you have a light by your bed that is easy to reach.  Do not use any sheets or blankets that are too big for your bed. They should not hang down onto the floor.  Have a firm chair that has side arms. You can use this for support while you get dressed.  Do not have throw rugs and other things on the floor that can make you trip. What can I do in the kitchen?  Clean up any spills right away.  Avoid walking on wet floors.  Keep items that you use a lot in easy-to-reach places.  If you need to reach something above you, use a strong step stool that has a grab bar.  Keep electrical cords out of the way.  Do not use floor polish or wax that makes floors slippery. If you must use wax, use non-skid floor wax.  Do not have throw rugs and other things on the floor that can make you trip. What can I do with  my stairs?  Do not leave any items on the stairs.  Make sure that there are handrails on both sides of the stairs and use them. Fix handrails that are broken or loose. Make sure that handrails are as long as the stairways.  Check any carpeting to make sure that it is firmly attached to the stairs. Fix any carpet that is loose or worn.  Avoid having throw rugs at the top or bottom of the stairs. If you do have throw rugs, attach them to the floor with carpet tape.  Make sure that you have a light switch at the top of the stairs and the bottom of the stairs. If you do not have them, ask someone to add them for you. What else can I do to help prevent falls?  Wear shoes that:  Do not have high heels.  Have rubber bottoms.  Are comfortable and fit you well.  Are closed at the toe. Do not wear sandals.  If you use a stepladder:  Make sure that it is fully opened. Do not climb a closed  stepladder.  Make sure that both sides of the stepladder are locked into place.  Ask someone to hold it for you, if possible.  Clearly mark and make sure that you can see:  Any grab bars or handrails.  First and last steps.  Where the edge of each step is.  Use tools that help you move around (mobility aids) if they are needed. These include:  Canes.  Walkers.  Scooters.  Crutches.  Turn on the lights when you go into a dark area. Replace any light bulbs as soon as they burn out.  Set up your furniture so you have a clear path. Avoid moving your furniture around.  If any of your floors are uneven, fix them.  If there are any pets around you, be aware of where they are.  Review your medicines with your doctor. Some medicines can make you feel dizzy. This can increase your chance of falling. Ask your doctor what other things that you can do to help prevent falls. This information is not intended to replace advice given to you by your health care provider. Make sure you discuss any questions you have with your health care provider. Document Released: 12/09/2008 Document Revised: 07/21/2015 Document Reviewed: 03/19/2014 Elsevier Interactive Patient Education  2017 [employer].

## 2018-03-26 NOTE — Progress Notes (Signed)
Subjective:     Patient ID: Scott Hinton , male    DOB: 30-Nov-1950 , 68 y.o.   MRN: [id number]   Chief Complaint  Patient presents with  . Hypothyroidism  . Hyperlipidemia    HPI  Thyroid Problem  Presents for follow-up visit. The symptoms have been resolved. His past medical history is significant for hyperlipidemia.    Hyperlipidemia  He reports compliance with atorvastatin '10mg'$  daily. He denies having any side effects from the medication.  Past Medical History:  Diagnosis Date  . Hypercholesterolemia   . Sleep apnea with use of continuous positive airway pressure (CPAP) 2010  . Thyroid disease      Family History  Problem Relation Age of Onset  . Cancer Mother   . Heart disease Father   . Emphysema Father   . Coronary artery disease Father   . Stroke Sister   . Pneumonia Brother   . Stroke Brother   . Heart disease Sister   . Epilepsy Brother      Current Outpatient Medications:  .  aspirin 81 MG chewable tablet, Chew 81 mg by mouth daily., Disp: , Rfl:  .  atorvastatin (LIPITOR) 10 MG tablet, TAKE 1 TABLET BY MOUTH EVERY DAY, Disp: 90 tablet, Rfl: 1 .  cholecalciferol (VITAMIN D) 1000 units tablet, Take 2,000 Units by mouth daily., Disp: , Rfl:  .  diazepam (VALIUM) 5 MG tablet, Take 1 tablet (5 mg total) by mouth every 12 (twelve) hours as needed (severe dizziness). (Patient not taking: Reported on 03/26/2018), Disp: 12 tablet, Rfl: 0 .  fenofibrate (TRICOR) 48 MG tablet, Take 40 mg by mouth daily., Disp: , Rfl:  .  levothyroxine (SYNTHROID, LEVOTHROID) 100 MCG tablet, TAKE 1 TABLET BY MOUTH DAILY (Patient taking differently: Take with 88 mcg), Disp: 30 tablet, Rfl: 0 .  levothyroxine (SYNTHROID, LEVOTHROID) 100 MCG tablet, TAKE 1 TABLET BY MOUTH DAILY (Patient taking differently: 88 mcg. Take with 152mg), Disp: 30 tablet, Rfl: 4 .  meclizine (ANTIVERT) 25 MG tablet, Take 1 tablet (25 mg total) by mouth 3 (three) times daily as needed for dizziness (mild  dizziness)., Disp: 30 tablet, Rfl: 0 .  Multiple Vitamin (MULTIVITAMIN) tablet, Take 1 tablet by mouth daily., Disp: , Rfl:  .  omeprazole (PRILOSEC) 40 MG capsule, Take 40 mg by mouth daily., Disp: , Rfl:  .  predniSONE (DELTASONE) 20 MG tablet, Take 2 tablets (40 mg total) by mouth daily. (Patient not taking: Reported on 03/26/2018), Disp: 6 tablet, Rfl: 0   No Known Allergies   Review of Systems  Constitutional: Negative.   Respiratory: Negative.   Cardiovascular: Negative.   Musculoskeletal: Positive for arthralgias.       He c/o l shoulder pain.  There is pain with movement upon awakening.   Neurological: Negative.   Psychiatric/Behavioral: Negative.      Today's Vitals   03/26/18 0907  BP: 108/64  Pulse: (!) 51  Temp: 98.4 F (36.9 C)  TempSrc: Oral  Weight: 184 lb (83.5 kg)  Height: 5' 8.4" (1.737 m)  PainSc: 0-No pain   Body mass index is 27.65 kg/m.   Objective:  Physical Exam Vitals signs and nursing note reviewed.  Constitutional:      Appearance: Normal appearance.  HENT:     Head: Normocephalic and atraumatic.  Cardiovascular:     Rate and Rhythm: Normal rate and regular rhythm.     Heart sounds: Normal heart sounds.  Pulmonary:     Effort: Pulmonary effort is  normal.     Breath sounds: Normal breath sounds.  Musculoskeletal:        General: Tenderness present.     Comments: Left shoulder tenderness, full ROM.   Neurological:     General: No focal deficit present.     Mental Status: He is alert and oriented to person, place, and time.  Psychiatric:        Mood and Affect: Mood normal.         Assessment And Plan:     1. Primary hypothyroidism  I will check a thyroid panel and adjust meds as needed.  He will rto in six months for re-evaluation.   - CMP14+EGFR - CBC - Lipid panel - TSH - T4, Free  2. Mixed hyperlipidemia  I will check fasting lipid panel and LFTs today. Importance of regular exercise was discussed with the patient.    3. Gastroesophageal reflux disease without esophagitis  Chronic, yet stable. He is encouraged to avoid foods that may trigger his symptoms.   4. OSA on CPAP  Chronic. Importance of CPAP compliance was discussed with the patient.   5. Chronic left shoulder pain  He will take ibuprofen '400mg'$  nightly w/ food for one week. He will let me know if his symptoms persist.   [name], MD

## 2018-03-27 ENCOUNTER — Other Ambulatory Visit: Payer: Self-pay

## 2018-03-27 LAB — CMP14+EGFR
A/G RATIO: 2.4 — AB (ref 1.2–2.2)
ALK PHOS: 67 IU/L (ref 39–117)
ALT: 20 IU/L (ref 0–44)
AST: 21 IU/L (ref 0–40)
Albumin: 4.7 g/dL (ref 3.8–4.8)
BILIRUBIN TOTAL: 0.4 mg/dL (ref 0.0–1.2)
BUN / CREAT RATIO: 19 (ref 10–24)
BUN: 21 mg/dL (ref 8–27)
CO2: 22 mmol/L (ref 20–29)
Calcium: 9.5 mg/dL (ref 8.6–10.2)
Chloride: 104 mmol/L (ref 96–106)
Creatinine, Ser: 1.1 mg/dL (ref 0.76–1.27)
GFR calc Af Amer: 79 mL/min/{1.73_m2} (ref >59)
GFR calc non Af Amer: 68 mL/min/{1.73_m2} (ref >59)
Globulin, Total: 2 g/dL (ref 1.5–4.5)
Glucose: 105 mg/dL — ABNORMAL HIGH (ref 65–99)
POTASSIUM: 4.6 mmol/L (ref 3.5–5.2)
SODIUM: 141 mmol/L (ref 134–144)
Total Protein: 6.7 g/dL (ref 6.0–8.5)

## 2018-03-27 LAB — T4, FREE: Free T4: 1.89 ng/dL — ABNORMAL HIGH (ref 0.82–1.77)

## 2018-03-27 LAB — CBC
Hematocrit: 45.5 % (ref 37.5–51.0)
Hemoglobin: 15.9 g/dL (ref 13.0–17.7)
MCH: 34.3 pg — AB (ref 26.6–33.0)
MCHC: 34.9 g/dL (ref 31.5–35.7)
MCV: 98 fL — ABNORMAL HIGH (ref 79–97)
PLATELETS: 174 10*3/uL (ref 150–450)
RBC: 4.64 x10E6/uL (ref 4.14–5.80)
RDW: 12.1 % (ref 11.6–15.4)
WBC: 5.1 10*3/uL (ref 3.4–10.8)

## 2018-03-27 LAB — LIPID PANEL
Chol/HDL Ratio: 3.2 ratio (ref 0.0–5.0)
Cholesterol, Total: 133 mg/dL (ref 100–199)
HDL: 42 mg/dL (ref >39)
LDL Calculated: 81 mg/dL (ref 0–99)
Triglycerides: 50 mg/dL (ref 0–149)
VLDL Cholesterol Cal: 10 mg/dL (ref 5–40)

## 2018-03-27 LAB — TSH: TSH: 0.553 u[IU]/mL (ref 0.450–4.500)

## 2018-04-05 ENCOUNTER — Encounter: Payer: Self-pay | Admitting: Internal Medicine

## 2018-04-07 ENCOUNTER — Encounter: Payer: Self-pay | Admitting: Internal Medicine

## 2018-04-10 ENCOUNTER — Other Ambulatory Visit: Payer: Self-pay | Admitting: Internal Medicine

## 2018-04-21 ENCOUNTER — Other Ambulatory Visit: Payer: Self-pay | Admitting: Internal Medicine

## 2018-07-27 ENCOUNTER — Other Ambulatory Visit: Payer: Self-pay | Admitting: Internal Medicine

## 2018-07-28 ENCOUNTER — Other Ambulatory Visit: Payer: Self-pay | Admitting: Internal Medicine

## 2018-08-04 IMAGING — CT CT HEAD W/O CM
3 series · 15 of 47 positions shown, 18 images · non-contrast
Comparison: 10/27/2015

CLINICAL DATA: Awoke with vertigo.

EXAM:
CT HEAD WITHOUT CONTRAST
TECHNIQUE: Contiguous axial images were obtained from the base of the skull
through the vertex without intravenous contrast.

[Series 2: head wo · axial · 0.47mm/px · z∈[+882,+1022]mm · 9 of 34 slices shown, 12 images]
[im 3/34  brain]
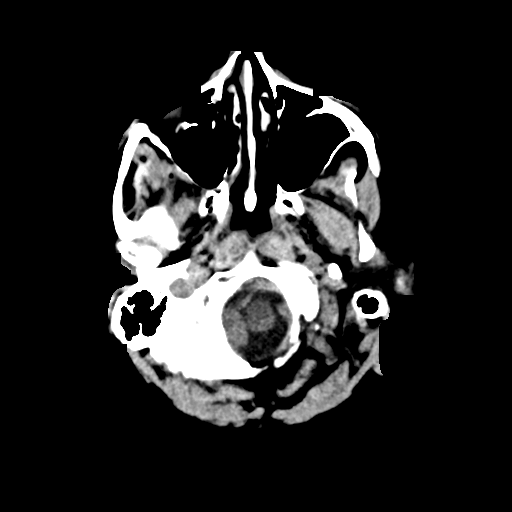
[im 3/34  bone]
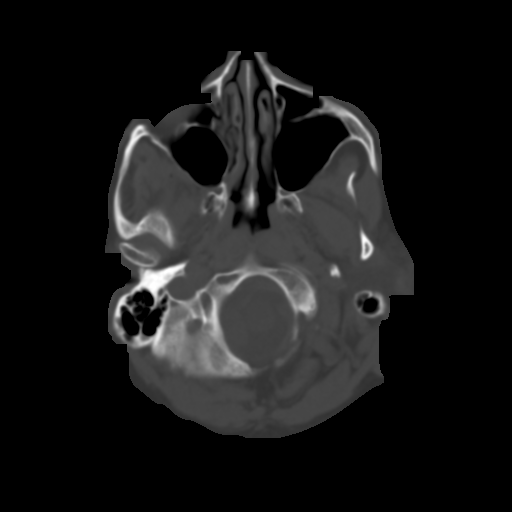
[im 6/34  brain]
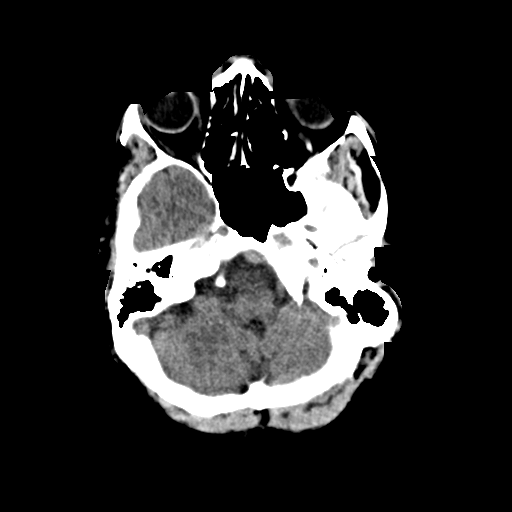
[im 10/34  brain]
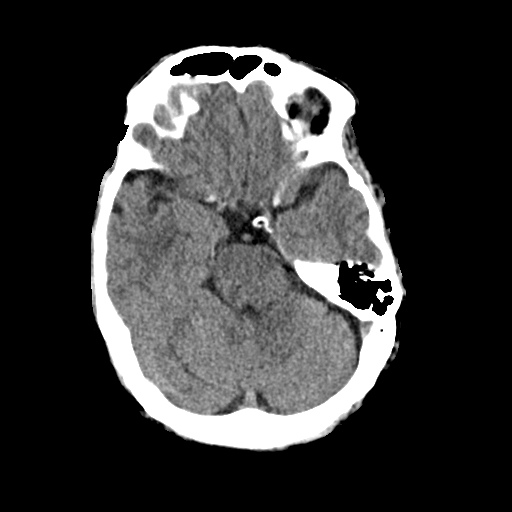
[im 13/34  brain]
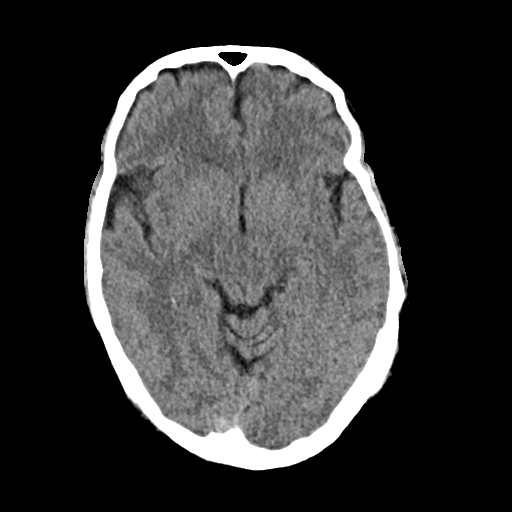
[im 18/34  brain]
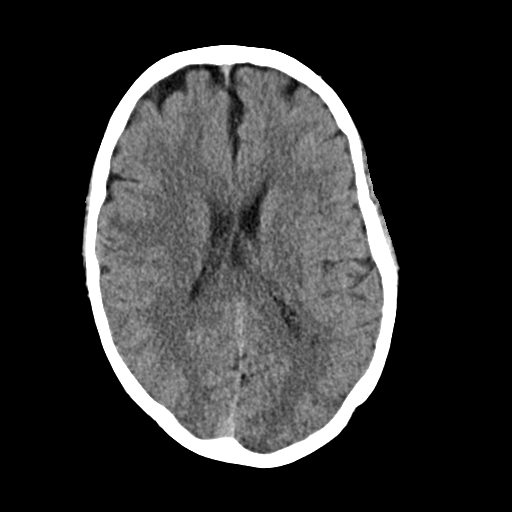
[im 18/34  bone]
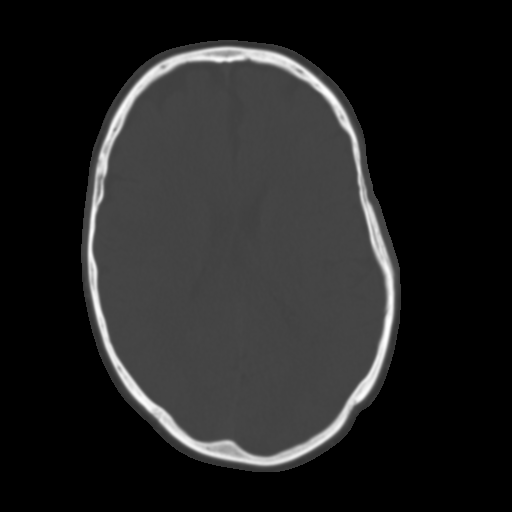
[im 21/34  brain]
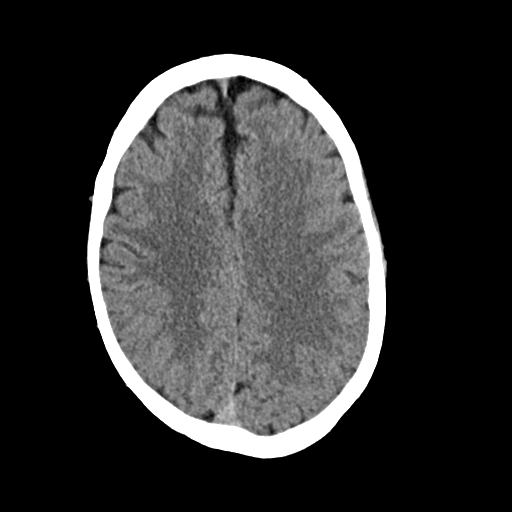
[im 24/34  brain]
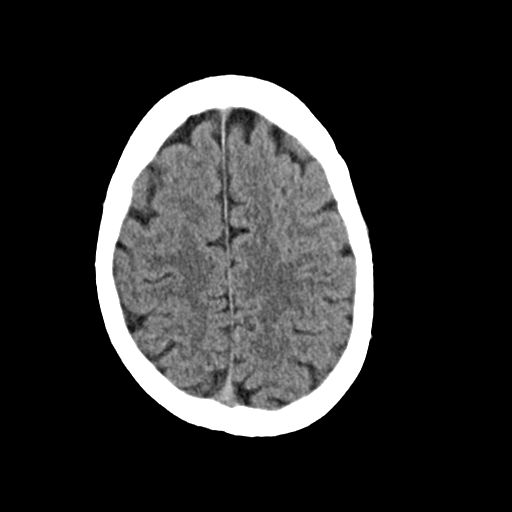
[im 28/34  brain]
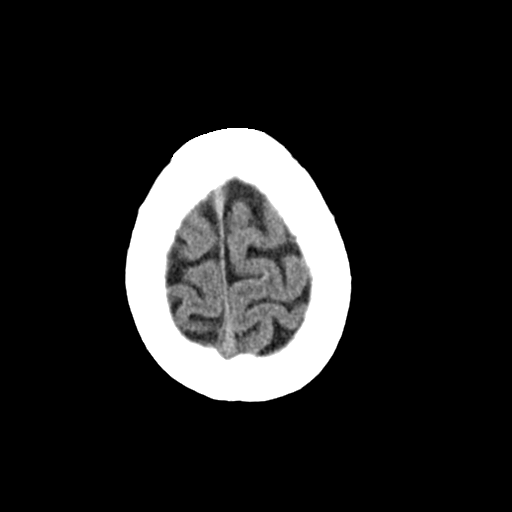
[im 31/34  brain]
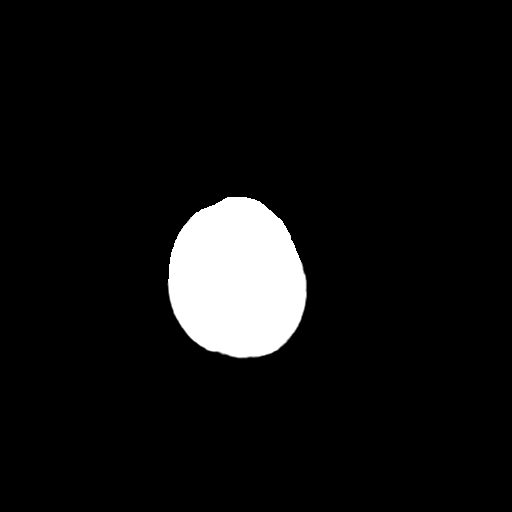
[im 31/34  bone]
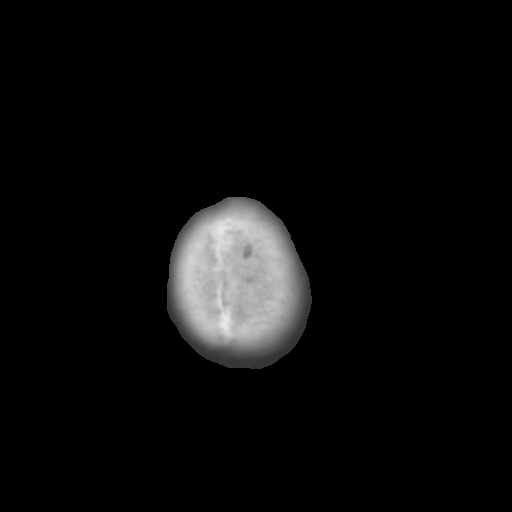

[Series 4: coronal soft · coronal · 0.32mm/px · 3 of 79 slices shown]
[im 27/79  brain]
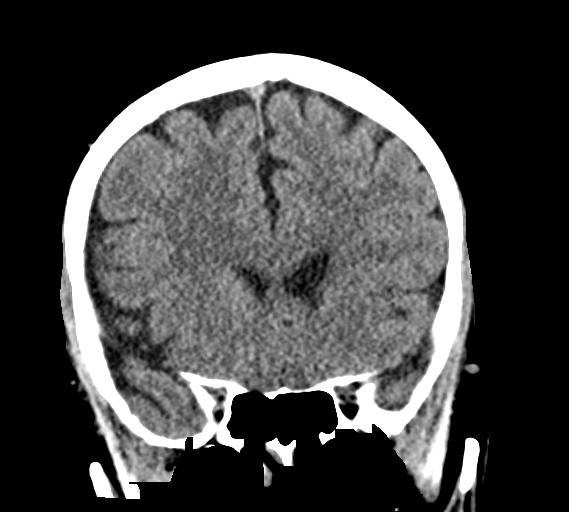
[im 35/79  brain]
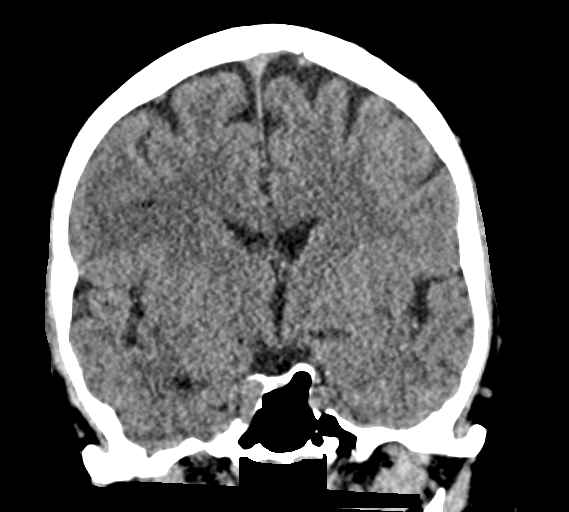
[im 44/79  brain]
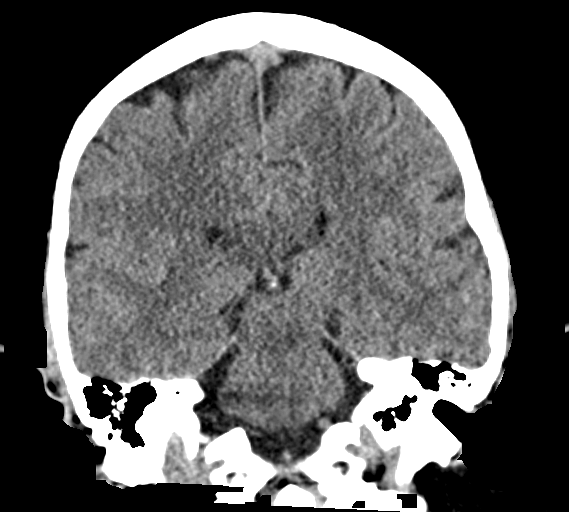

[Series 5: sag soft · sagittal · 0.33mm/px · 3 of 61 slices shown]
[im 21/61  brain]
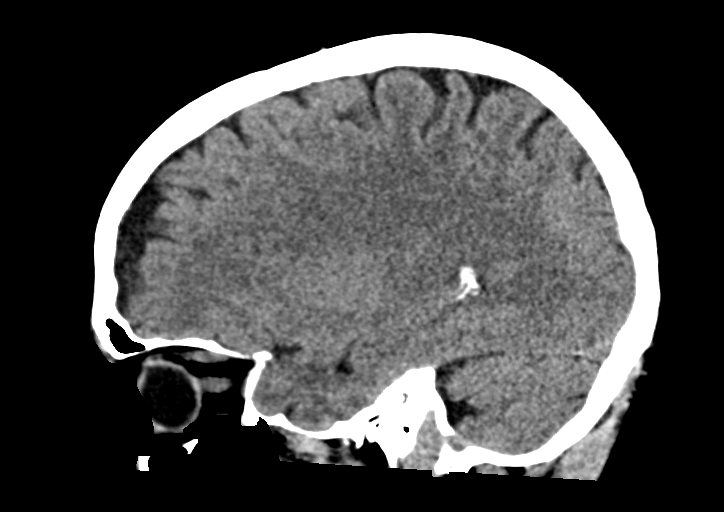
[im 31/61  brain]
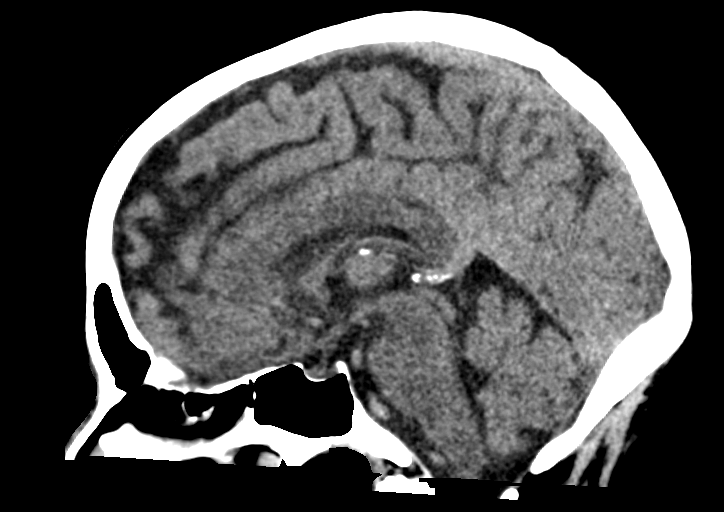
[im 41/61  brain]
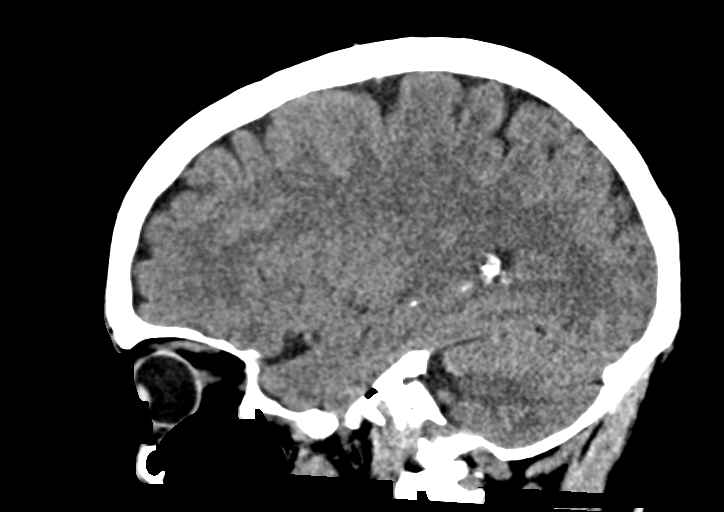

[15 of 47 positions shown; findings below may reference images not displayed]

FINDINGS: Brain: No evidence of acute infarction, hemorrhage, hydrocephalus,
extra-axial collection or mass lesion/mass effect. Brain volume is
normal for age.

Vascular: No hyperdense vessel or unexpected calcification.

Skull: Normal. Negative for fracture or focal lesion.

Sinuses/Orbits: Paranasal sinuses and mastoid air cells are clear.
The visualized orbits are unremarkable.

Other: None pain
IMPRESSION: No acute intracranial abnormality.

## 2018-08-09 ENCOUNTER — Other Ambulatory Visit: Payer: Self-pay | Admitting: Internal Medicine

## 2018-09-04 ENCOUNTER — Other Ambulatory Visit: Payer: Self-pay | Admitting: Internal Medicine

## 2018-09-24 ENCOUNTER — Other Ambulatory Visit: Payer: Self-pay

## 2018-09-24 ENCOUNTER — Encounter: Payer: Self-pay | Admitting: Internal Medicine

## 2018-09-24 ENCOUNTER — Ambulatory Visit (INDEPENDENT_AMBULATORY_CARE_PROVIDER_SITE_OTHER): Payer: Medicare Other | Admitting: Internal Medicine

## 2018-09-24 VITALS — BP 116/74 | HR 46 | Temp 98.1°F | Ht 68.0 in | Wt 177.4 lb

## 2018-09-24 DIAGNOSIS — E782 Mixed hyperlipidemia: Secondary | ICD-10-CM

## 2018-09-24 DIAGNOSIS — R001 Bradycardia, unspecified: Secondary | ICD-10-CM | POA: Diagnosis not present

## 2018-09-24 DIAGNOSIS — E663 Overweight: Secondary | ICD-10-CM | POA: Diagnosis not present

## 2018-09-24 DIAGNOSIS — L989 Disorder of the skin and subcutaneous tissue, unspecified: Secondary | ICD-10-CM

## 2018-09-24 DIAGNOSIS — E039 Hypothyroidism, unspecified: Secondary | ICD-10-CM

## 2018-09-24 NOTE — Patient Instructions (Signed)

## 2018-09-24 NOTE — Progress Notes (Signed)
Subjective:     Patient ID: Scott Hinton , male    DOB: 1950/03/03 , 68 y.o.   MRN: [id number]   Chief Complaint  Patient presents with  . Hypothyroidism  . Hyperlipidemia  . Dermatology referral    HPI  He presents today for thyroid check and chol check. He reports compliance with medications.  Thyroid Problem Presents for follow-up visit. Patient reports no cold intolerance, constipation or depressed mood. The symptoms have been stable.     Past Medical History:  Diagnosis Date  . Hypercholesterolemia   . Sleep apnea with use of continuous positive airway pressure (CPAP) 2010  . Thyroid disease      Family History  Problem Relation Age of Onset  . Cancer Mother   . Heart disease Father   . Emphysema Father   . Coronary artery disease Father   . Stroke Sister   . Pneumonia Brother   . Stroke Brother   . Heart disease Sister   . Epilepsy Brother      Current Outpatient Medications:  .  aspirin 81 MG chewable tablet, Chew 81 mg by mouth daily., Disp: , Rfl:  .  atorvastatin (LIPITOR) 10 MG tablet, TAKE 1 TABLET BY MOUTH EVERY DAY, Disp: 90 tablet, Rfl: 1 .  cholecalciferol (VITAMIN D) 1000 units tablet, Take 2,000 Units by mouth daily., Disp: , Rfl:  .  fenofibrate (TRICOR) 48 MG tablet, TAKE 1 TABLET BY MOUTH DAILY, Disp: 90 tablet, Rfl: 2 .  levothyroxine (SYNTHROID) 100 MCG tablet, TAKE 1 TABLET BY MOUTH DAILY, Disp: 30 tablet, Rfl: 4 .  levothyroxine (SYNTHROID) 88 MCG tablet, TAKE 1 TABLET BY MOUTH EVERY DAY ALONG WITH 100 MCG (Patient taking differently: Monday - Saturday), Disp: 90 tablet, Rfl: 1 .  Multiple Vitamin (MULTIVITAMIN) tablet, Take 1 tablet by mouth daily., Disp: , Rfl:  .  omeprazole (PRILOSEC) 40 MG capsule, TAKE 1 CAPSULE BY MOUTH EVERY DAY, Disp: 90 capsule, Rfl: 1 .  meclizine (ANTIVERT) 25 MG tablet, Take 1 tablet (25 mg total) by mouth 3 (three) times daily as needed for dizziness (mild dizziness). (Patient not taking: Reported on  09/24/2018), Disp: 30 tablet, Rfl: 0   No Known Allergies   Review of Systems  Constitutional: Negative.   Respiratory: Negative.   Cardiovascular: Negative.        He inquired about his low heart rate. He denies cp/sob. He exercises regularly without symptoms.   Gastrointestinal: Negative.  Negative for constipation.  Endocrine: Negative for cold intolerance.  Skin:       C/o skin lesion behind left ear. Has had similar lesions in the past, removed by dermatology. Wants referral  Neurological: Negative.   Psychiatric/Behavioral: Negative.      Today's Vitals   09/24/18 0910  BP: 116/74  Pulse: (!) 46  Temp: 98.1 F (36.7 C)  TempSrc: Oral  Weight: 177 lb 6.4 oz (80.5 kg)  Height: '5\' 8"'$  (1.727 m)  PainSc: 10-Worst pain ever  PainLoc: Neck   Body mass index is 26.97 kg/m.   Objective:  Physical Exam Vitals signs and nursing note reviewed.  Constitutional:      Appearance: Normal appearance.  Cardiovascular:     Rate and Rhythm: Regular rhythm. Bradycardia present.     Heart sounds: Normal heart sounds.  Pulmonary:     Effort: Pulmonary effort is normal.     Breath sounds: Normal breath sounds.  Skin:    General: Skin is warm.     Comments: Scaly, papular  lesion behind left ear. There is some discoloration. Nontender to palpation  Neurological:     General: No focal deficit present.     Mental Status: He is alert.  Psychiatric:        Mood and Affect: Mood normal.         Assessment And Plan:  1. Primary hypothyroidism  I will check thyroid panel and adjust meds as needed.  - TSH - T4, Free  2. Mixed hyperlipidemia  I will check fasting lipid panel and LFTs. Importance of medication compliance was discussed with the patient. He will rto in six months for full physical exam and AWV.   - Lipid panel - CMP14+EGFR  3. Skin lesion  I will refer him to Derm for excision and surgical evaluation.   - Ambulatory referral to Dermatology  4.  Bradycardia  Chronic. Previous ekgs reviewed - HR between 45-50. He was advised he should let me know if he develops fatigue, sob on exertion. This is likely exacerbated by his hypothyroid state and his active lifestyle.   5. Overweight (BMI 25.0-29.9)  His weight is stable. He was congratulated on his 7 pound weight loss.    [name], MD    THE PATIENT IS ENCOURAGED TO PRACTICE SOCIAL DISTANCING DUE TO THE COVID-19 PANDEMIC.

## 2018-09-25 LAB — CMP14+EGFR
ALT: 18 IU/L (ref 0–44)
AST: 21 IU/L (ref 0–40)
Albumin/Globulin Ratio: 2.2 (ref 1.2–2.2)
Albumin: 4.6 g/dL (ref 3.8–4.8)
Alkaline Phosphatase: 55 IU/L (ref 39–117)
BUN/Creatinine Ratio: 20 (ref 10–24)
BUN: 23 mg/dL (ref 8–27)
Bilirubin Total: 0.5 mg/dL (ref 0.0–1.2)
CO2: 24 mmol/L (ref 20–29)
Calcium: 9.6 mg/dL (ref 8.6–10.2)
Chloride: 103 mmol/L (ref 96–106)
Creatinine, Ser: 1.15 mg/dL (ref 0.76–1.27)
GFR calc Af Amer: 75 mL/min/{1.73_m2} (ref >59)
GFR calc non Af Amer: 65 mL/min/{1.73_m2} (ref >59)
Globulin, Total: 2.1 g/dL (ref 1.5–4.5)
Glucose: 100 mg/dL — ABNORMAL HIGH (ref 65–99)
Potassium: 4.9 mmol/L (ref 3.5–5.2)
Sodium: 141 mmol/L (ref 134–144)
Total Protein: 6.7 g/dL (ref 6.0–8.5)

## 2018-09-25 LAB — LIPID PANEL
Chol/HDL Ratio: 2.6 ratio (ref 0.0–5.0)
Cholesterol, Total: 119 mg/dL (ref 100–199)
HDL: 45 mg/dL (ref >39)
LDL Calculated: 66 mg/dL (ref 0–99)
Triglycerides: 42 mg/dL (ref 0–149)
VLDL Cholesterol Cal: 8 mg/dL (ref 5–40)

## 2018-09-25 LAB — TSH: TSH: 0.164 u[IU]/mL — ABNORMAL LOW (ref 0.450–4.500)

## 2018-09-25 LAB — T4, FREE: Free T4: 1.91 ng/dL — ABNORMAL HIGH (ref 0.82–1.77)

## 2018-12-26 DIAGNOSIS — H838X3 Other specified diseases of inner ear, bilateral: Secondary | ICD-10-CM | POA: Diagnosis not present

## 2018-12-26 DIAGNOSIS — H8103 Meniere's disease, bilateral: Secondary | ICD-10-CM | POA: Diagnosis not present

## 2018-12-26 DIAGNOSIS — H6123 Impacted cerumen, bilateral: Secondary | ICD-10-CM | POA: Diagnosis not present

## 2018-12-26 DIAGNOSIS — H903 Sensorineural hearing loss, bilateral: Secondary | ICD-10-CM | POA: Diagnosis not present

## 2019-01-05 ENCOUNTER — Other Ambulatory Visit: Payer: Self-pay

## 2019-01-05 MED ORDER — FENOFIBRATE 48 MG PO TABS: 48.0000 mg | ORAL_TABLET | Freq: Every day | ORAL | 2 refills | Status: DC

## 2019-01-14 ENCOUNTER — Telehealth: Payer: Self-pay

## 2019-01-14 NOTE — Telephone Encounter (Signed)
The pt wanted to know what he can take for his sinus drainage and he wanted to know if he should be tested for the coronavirus. The pt was told that Dr. Baird Cancer said to try zyrtec or allegra and yes to go to the [facility] drive thru site to be tested.

## 2019-01-15 ENCOUNTER — Other Ambulatory Visit: Payer: Self-pay

## 2019-01-15 DIAGNOSIS — Z20828 Contact with and (suspected) exposure to other viral communicable diseases: Secondary | ICD-10-CM | POA: Diagnosis not present

## 2019-01-15 DIAGNOSIS — Z20822 Contact with and (suspected) exposure to covid-19: Secondary | ICD-10-CM

## 2019-01-17 LAB — NOVEL CORONAVIRUS, NAA: SARS-CoV-2, NAA: NOT DETECTED

## 2019-02-07 ENCOUNTER — Encounter: Payer: Self-pay | Admitting: Internal Medicine

## 2019-02-09 ENCOUNTER — Encounter: Payer: Self-pay | Admitting: Internal Medicine

## 2019-02-09 ENCOUNTER — Other Ambulatory Visit: Payer: Self-pay

## 2019-02-09 MED ORDER — MECLIZINE HCL 25 MG PO TABS: 25.0000 mg | ORAL_TABLET | Freq: Three times a day (TID) | ORAL | 1 refills | Status: DC | PRN

## 2019-02-10 ENCOUNTER — Encounter: Payer: Self-pay | Admitting: Internal Medicine

## 2019-02-11 ENCOUNTER — Encounter: Payer: Self-pay | Admitting: Internal Medicine

## 2019-02-11 ENCOUNTER — Other Ambulatory Visit: Payer: Self-pay

## 2019-02-11 MED ORDER — OMEPRAZOLE 40 MG PO CPDR: DELAYED_RELEASE_CAPSULE | ORAL | 1 refills | Status: DC

## 2019-02-16 ENCOUNTER — Encounter: Payer: Self-pay | Admitting: Internal Medicine

## 2019-03-01 ENCOUNTER — Other Ambulatory Visit: Payer: Self-pay | Admitting: Internal Medicine

## 2019-04-01 ENCOUNTER — Ambulatory Visit: Payer: Medicare Other

## 2019-04-01 ENCOUNTER — Encounter: Payer: Self-pay | Admitting: Internal Medicine

## 2019-04-01 ENCOUNTER — Ambulatory Visit: Payer: Medicare Other | Admitting: Internal Medicine

## 2019-04-09 ENCOUNTER — Other Ambulatory Visit: Payer: Self-pay

## 2019-04-09 ENCOUNTER — Encounter: Payer: Self-pay | Admitting: Internal Medicine

## 2019-04-09 ENCOUNTER — Ambulatory Visit (INDEPENDENT_AMBULATORY_CARE_PROVIDER_SITE_OTHER): Payer: Medicare Other

## 2019-04-09 ENCOUNTER — Ambulatory Visit (INDEPENDENT_AMBULATORY_CARE_PROVIDER_SITE_OTHER): Payer: Medicare Other | Admitting: Internal Medicine

## 2019-04-09 VITALS — BP 106/62 | HR 47 | Temp 97.7°F | Ht 68.4 in | Wt 174.0 lb

## 2019-04-09 DIAGNOSIS — E782 Mixed hyperlipidemia: Secondary | ICD-10-CM

## 2019-04-09 DIAGNOSIS — Z6826 Body mass index (BMI) 26.0-26.9, adult: Secondary | ICD-10-CM | POA: Diagnosis not present

## 2019-04-09 DIAGNOSIS — R001 Bradycardia, unspecified: Secondary | ICD-10-CM | POA: Diagnosis not present

## 2019-04-09 DIAGNOSIS — Z79899 Other long term (current) drug therapy: Secondary | ICD-10-CM | POA: Diagnosis not present

## 2019-04-09 DIAGNOSIS — Z0000 Encounter for general adult medical examination without abnormal findings: Secondary | ICD-10-CM | POA: Diagnosis not present

## 2019-04-09 DIAGNOSIS — E663 Overweight: Secondary | ICD-10-CM

## 2019-04-09 DIAGNOSIS — E039 Hypothyroidism, unspecified: Secondary | ICD-10-CM

## 2019-04-09 NOTE — Patient Instructions (Signed)
Hypothyroidism  Hypothyroidism is when the thyroid gland does not make enough of certain hormones (it is underactive). The thyroid gland is a small gland located in the lower front part of the neck, just in front of the windpipe (trachea). This gland makes hormones that help control how the body uses food for energy (metabolism) as well as how the heart and brain function. These hormones also play a role in keeping your bones strong. When the thyroid is underactive, it produces too little of the hormones thyroxine (T4) and triiodothyronine (T3). What are the causes? This condition may be caused by:  Hashimoto's disease. This is a disease in which the body's disease-fighting system (immune system) attacks the thyroid gland. This is the most common cause.  Viral infections.  Pregnancy.  Certain medicines.  Birth defects.  Past radiation treatments to the head or neck for cancer.  Past treatment with radioactive iodine.  Past exposure to radiation in the environment.  Past surgical removal of part or all of the thyroid.  Problems with a gland in the center of the brain (pituitary gland).  Lack of enough iodine in the diet. What increases the risk? You are more likely to develop this condition if:  You are male.  You have a family history of thyroid conditions.  You use a medicine called lithium.  You take medicines that affect the immune system (immunosuppressants). What are the signs or symptoms? Symptoms of this condition include:  Feeling as though you have no energy (lethargy).  Not being able to tolerate cold.  Weight gain that is not explained by a change in diet or exercise habits.  Lack of appetite.  Dry skin.  Coarse hair.  Menstrual irregularity.  Slowing of thought processes.  Constipation.  Sadness or depression. How is this diagnosed? This condition may be diagnosed based on:  Your symptoms, your medical history, and a physical exam.  Blood  tests. You may also have imaging tests, such as an ultrasound or MRI. How is this treated? This condition is treated with medicine that replaces the thyroid hormones that your body does not make. After you begin treatment, it may take several weeks for symptoms to go away. Follow these instructions at home:  Take over-the-counter and prescription medicines only as told by your health care provider.  If you start taking any new medicines, tell your health care provider.  Keep all follow-up visits as told by your health care provider. This is important. ? As your condition improves, your dosage of thyroid hormone medicine may change. ? You will need to have blood tests regularly so that your health care provider can monitor your condition. Contact a health care provider if:  Your symptoms do not get better with treatment.  You are taking thyroid replacement medicine and you: ? Sweat a lot. ? Have tremors. ? Feel anxious. ? Lose weight rapidly. ? Cannot tolerate heat. ? Have emotional swings. ? Have diarrhea. ? Feel weak. Get help right away if you have:  Chest pain.  An irregular heartbeat.  A rapid heartbeat.  Difficulty breathing. Summary  Hypothyroidism is when the thyroid gland does not make enough of certain hormones (it is underactive).  When the thyroid is underactive, it produces too little of the hormones thyroxine (T4) and triiodothyronine (T3).  The most common cause is Hashimoto's disease, a disease in which the body's disease-fighting system (immune system) attacks the thyroid gland. The condition can also be caused by viral infections, medicine, pregnancy, or past   radiation treatment to the head or neck.  Symptoms may include weight gain, dry skin, constipation, feeling as though you do not have energy, and not being able to tolerate cold.  This condition is treated with medicine to replace the thyroid hormones that your body does not make. This information  is not intended to replace advice given to you by your health care provider. Make sure you discuss any questions you have with your health care provider. Document Revised: 01/25/2017 Document Reviewed: 01/23/2017 Elsevier Patient Education  2020 [employer]  

## 2019-04-09 NOTE — Patient Instructions (Signed)
Scott Hinton , Thank you for taking time to come for your Medicare Wellness Visit. I appreciate your ongoing commitment to your health goals. Please review the following plan we discussed and let me know if I can assist you in the future.   Screening recommendations/referrals: Colonoscopy: 07/2012 Recommended yearly ophthalmology/optometry visit for glaucoma screening and checkup Recommended yearly dental visit for hygiene and checkup  Vaccinations: Influenza vaccine: 12/2018 Pneumococcal vaccine: 03/2018 Tdap vaccine: 10/2013 Shingles vaccine: discussed    Advanced directives: Advance directive discussed with you today. Even though you declined this today please call our office should you change your mind and we can give you the proper paperwork for you to fill out.   Conditions/risks identified: overweight  Next appointment: 10/13/2019 at 10:30  Preventive Care 38 Years and Older, Male Preventive care refers to lifestyle choices and visits with your health care provider that can promote health and wellness. What does preventive care include?  A yearly physical exam. This is also called an annual well check.  Dental exams once or twice a year.  Routine eye exams. Ask your health care provider how often you should have your eyes checked.  Personal lifestyle choices, including:  Daily care of your teeth and gums.  Regular physical activity.  Eating a healthy diet.  Avoiding tobacco and drug use.  Limiting alcohol use.  Practicing safe sex.  Taking low doses of aspirin every day.  Taking vitamin and mineral supplements as recommended by your health care provider. What happens during an annual well check? The services and screenings done by your health care provider during your annual well check will depend on your age, overall health, lifestyle risk factors, and family history of disease. Counseling  Your health care provider may ask you questions about your:  Alcohol  use.  Tobacco use.  Drug use.  Emotional well-being.  Home and relationship well-being.  Sexual activity.  Eating habits.  History of falls.  Memory and ability to understand (cognition).  Work and work Statistician. Screening  You may have the following tests or measurements:  Height, weight, and BMI.  Blood pressure.  Lipid and cholesterol levels. These may be checked every 5 years, or more frequently if you are over 16 years old.  Skin check.  Lung cancer screening. You may have this screening every year starting at age 79 if you have a 30-pack-year history of smoking and currently smoke or have quit within the past 15 years.  Fecal occult blood test (FOBT) of the stool. You may have this test every year starting at age 30.  Flexible sigmoidoscopy or colonoscopy. You may have a sigmoidoscopy every 5 years or a colonoscopy every 10 years starting at age 80.  Prostate cancer screening. Recommendations will vary depending on your family history and other risks.  Hepatitis C blood test.  Hepatitis B blood test.  Sexually transmitted disease (STD) testing.  Diabetes screening. This is done by checking your blood sugar (glucose) after you have not eaten for a while (fasting). You may have this done every 1-3 years.  Abdominal aortic aneurysm (AAA) screening. You may need this if you are a current or former smoker.  Osteoporosis. You may be screened starting at age 75 if you are at high risk. Talk with your health care provider about your test results, treatment options, and if necessary, the need for more tests. Vaccines  Your health care provider may recommend certain vaccines, such as:  Influenza vaccine. This is recommended every year.  Tetanus, diphtheria, and acellular pertussis (Tdap, Td) vaccine. You may need a Td booster every 10 years.  Zoster vaccine. You may need this after age 31.  Pneumococcal 13-valent conjugate (PCV13) vaccine. One dose is  recommended after age 15.  Pneumococcal polysaccharide (PPSV23) vaccine. One dose is recommended after age 79. Talk to your health care provider about which screenings and vaccines you need and how often you need them. This information is not intended to replace advice given to you by your health care provider. Make sure you discuss any questions you have with your health care provider. Document Released: 03/11/2015 Document Revised: 11/02/2015 Document Reviewed: 12/14/2014 Elsevier Interactive Patient Education  2017 [place] Prevention in the Home Falls can cause injuries. They can happen to people of all ages. There are many things you can do to make your home safe and to help prevent falls. What can I do on the outside of my home?  Regularly fix the edges of walkways and driveways and fix any cracks.  Remove anything that might make you trip as you walk through a door, such as a raised step or threshold.  Trim any bushes or trees on the path to your home.  Use bright outdoor lighting.  Clear any walking paths of anything that might make someone trip, such as rocks or tools.  Regularly check to see if handrails are loose or broken. Make sure that both sides of any steps have handrails.  Any raised decks and porches should have guardrails on the edges.  Have any leaves, snow, or ice cleared regularly.  Use sand or salt on walking paths during winter.  Clean up any spills in your garage right away. This includes oil or grease spills. What can I do in the bathroom?  Use night lights.  Install grab bars by the toilet and in the tub and shower. Do not use towel bars as grab bars.  Use non-skid mats or decals in the tub or shower.  If you need to sit down in the shower, use a plastic, non-slip stool.  Keep the floor dry. Clean up any water that spills on the floor as soon as it happens.  Remove soap buildup in the tub or shower regularly.  Attach bath mats  securely with double-sided non-slip rug tape.  Do not have throw rugs and other things on the floor that can make you trip. What can I do in the bedroom?  Use night lights.  Make sure that you have a light by your bed that is easy to reach.  Do not use any sheets or blankets that are too big for your bed. They should not hang down onto the floor.  Have a firm chair that has side arms. You can use this for support while you get dressed.  Do not have throw rugs and other things on the floor that can make you trip. What can I do in the kitchen?  Clean up any spills right away.  Avoid walking on wet floors.  Keep items that you use a lot in easy-to-reach places.  If you need to reach something above you, use a strong step stool that has a grab bar.  Keep electrical cords out of the way.  Do not use floor polish or wax that makes floors slippery. If you must use wax, use non-skid floor wax.  Do not have throw rugs and other things on the floor that can make you trip. What can I do  with my stairs?  Do not leave any items on the stairs.  Make sure that there are handrails on both sides of the stairs and use them. Fix handrails that are broken or loose. Make sure that handrails are as long as the stairways.  Check any carpeting to make sure that it is firmly attached to the stairs. Fix any carpet that is loose or worn.  Avoid having throw rugs at the top or bottom of the stairs. If you do have throw rugs, attach them to the floor with carpet tape.  Make sure that you have a light switch at the top of the stairs and the bottom of the stairs. If you do not have them, ask someone to add them for you. What else can I do to help prevent falls?  Wear shoes that:  Do not have high heels.  Have rubber bottoms.  Are comfortable and fit you well.  Are closed at the toe. Do not wear sandals.  If you use a stepladder:  Make sure that it is fully opened. Do not climb a closed  stepladder.  Make sure that both sides of the stepladder are locked into place.  Ask someone to hold it for you, if possible.  Clearly mark and make sure that you can see:  Any grab bars or handrails.  First and last steps.  Where the edge of each step is.  Use tools that help you move around (mobility aids) if they are needed. These include:  Canes.  Walkers.  Scooters.  Crutches.  Turn on the lights when you go into a dark area. Replace any light bulbs as soon as they burn out.  Set up your furniture so you have a clear path. Avoid moving your furniture around.  If any of your floors are uneven, fix them.  If there are any pets around you, be aware of where they are.  Review your medicines with your doctor. Some medicines can make you feel dizzy. This can increase your chance of falling. Ask your doctor what other things that you can do to help prevent falls. This information is not intended to replace advice given to you by your health care provider. Make sure you discuss any questions you have with your health care provider. Document Released: 12/09/2008 Document Revised: 07/21/2015 Document Reviewed: 03/19/2014 Elsevier Interactive Patient Education  2017 [employer].

## 2019-04-09 NOTE — Progress Notes (Signed)
This visit occurred during the SARS-CoV-2 public health emergency.  Safety protocols were in place, including screening questions prior to the visit, additional usage of staff PPE, and extensive cleaning of exam room while observing appropriate contact time as indicated for disinfecting solutions.  Subjective:   Scott Hinton is a 69 y.o. male who presents for Medicare Annual/Subsequent preventive examination.  Review of Systems:  n/a Cardiac Risk Factors include: advanced age (>40men, >14 women);dyslipidemia;male gender     Objective:    Vitals: BP 106/62 (BP Location: Left Arm, Patient Position: Sitting, Cuff Size: Normal)   Pulse (!) 47   Temp 97.7 F (36.5 C) (Oral)   Ht 5' 8.4" (1.737 m)   Wt 174 lb (78.9 kg)   SpO2 97%   BMI 26.15 kg/m   Body mass index is 26.15 kg/m.  Advanced Directives 04/09/2019 03/26/2018 01/27/2017 09/01/2016  Does Patient Have a Medical Advance Directive? No No No No  Would patient like information on creating a medical advance directive? No - Patient declined - No - Patient declined No - Patient declined    Tobacco Social History   Tobacco Use  Smoking Status Never Smoker  Smokeless Tobacco Never Used     Counseling given: Not Answered   Clinical Intake:  Pre-visit preparation completed: Yes  Pain : No/denies pain     Nutritional Status: BMI 25 -29 Overweight Nutritional Risks: None Diabetes: No  How often do you need to have someone help you when you read instructions, pamphlets, or other written materials from your doctor or pharmacy?: 1 - Never What is the last grade level you completed in school?: 2 years college  Interpreter Needed?: No  Information entered by :: NAllen LPN  Past Medical History:  Diagnosis Date  . Hypercholesterolemia   . Sleep apnea with use of continuous positive airway pressure (CPAP) 2010  . Thyroid disease    Past Surgical History:  Procedure Laterality Date  . NASAL POLYP SURGERY  1984   Family  History  Problem Relation Age of Onset  . Cancer Mother   . Heart disease Father   . Emphysema Father   . Coronary artery disease Father   . Stroke Sister   . Pneumonia Brother   . Stroke Brother   . Heart disease Sister   . Epilepsy Brother    Social History   Socioeconomic History  . Marital status: Married    Spouse name: Not on file  . Number of children: Not on file  . Years of education: Not on file  . Highest education level: Not on file  Occupational History  . Occupation: retired  Tobacco Use  . Smoking status: Never Smoker  . Smokeless tobacco: Never Used  Substance and Sexual Activity  . Alcohol use: No  . Drug use: No  . Sexual activity: Not Currently  Other Topics Concern  . Not on file  Social History Narrative  . Not on file   Social Determinants of Health   Financial Resource Strain: Low Risk   . Difficulty of Paying Living Expenses: Not hard at all  Food Insecurity: No Food Insecurity  . Worried About Charity fundraiser in the Last Year: Never true  . Ran Out of Food in the Last Year: Never true  Transportation Needs: No Transportation Needs  . Lack of Transportation (Medical): No  . Lack of Transportation (Non-Medical): No  Physical Activity: Sufficiently Active  . Days of Exercise per Week: 7 days  . Minutes of  Exercise per Session: 80 min  Stress: No Stress Concern Present  . Feeling of Stress : Not at all  Social Connections:   . Frequency of Communication with Friends and Family: Not on file  . Frequency of Social Gatherings with Friends and Family: Not on file  . Attends Religious Services: Not on file  . Active Member of Clubs or Organizations: Not on file  . Attends Archivist Meetings: Not on file  . Marital Status: Not on file    Outpatient Encounter Medications as of 04/09/2019  Medication Sig  . aspirin 81 MG chewable tablet Chew 81 mg by mouth daily.  Marland Kitchen atorvastatin (LIPITOR) 10 MG tablet TAKE 1 TABLET BY MOUTH EVERY  DAY  . cholecalciferol (VITAMIN D) 1000 units tablet Take 2,000 Units by mouth daily.  . fenofibrate (TRICOR) 48 MG tablet Take 1 tablet (48 mg total) by mouth daily.  Marland Kitchen levothyroxine (SYNTHROID) 100 MCG tablet TAKE 1 TABLET BY MOUTH DAILY (Patient taking differently: Takes 1/2 tab on Sundays.)  . levothyroxine (SYNTHROID) 88 MCG tablet TAKE 1 TABLET BY MOUTH EVERY DAY ALONG WITH 100 MCG (Patient taking differently: Monday - Saturday)  . meclizine (ANTIVERT) 25 MG tablet Take 1 tablet (25 mg total) by mouth 3 (three) times daily as needed for dizziness (mild dizziness).  . Multiple Vitamin (MULTIVITAMIN) tablet Take 1 tablet by mouth daily.  Marland Kitchen omeprazole (PRILOSEC) 40 MG capsule TAKE 1 CAPSULE BY MOUTH EVERY DAY  . prednisoLONE acetate (PRED FORTE) 1 % ophthalmic suspension SMARTSIG:4 Drop(s) In Ear(s) Twice Daily PRN   No facility-administered encounter medications on file as of 04/09/2019.    Activities of Daily Living In your present state of health, do you have any difficulty performing the following activities: 04/09/2019  Hearing? Y  Comment wears hearing aides  Vision? N  Difficulty concentrating or making decisions? N  Walking or climbing stairs? N  Dressing or bathing? N  Doing errands, shopping? N  Preparing Food and eating ? N  Using the Toilet? N  In the past six months, have you accidently leaked urine? N  Do you have problems with loss of bowel control? N  Managing your Medications? N  Managing your Finances? N  Housekeeping or managing your Housekeeping? N  Some recent data might be hidden    Patient Care Team: Glendale Chard, MD as PCP - General (Internal Medicine)   Assessment:   This is a routine wellness examination for Scott Hinton.  Exercise Activities and Dietary recommendations Current Exercise Habits: Home exercise routine, Type of exercise: walking, Time (Minutes): > 60, Frequency (Times/Week): 7, Weekly Exercise (Minutes/Week): 0  Goals    . Patient Stated  (pt-stated)     Continue exercising with total gym and walking. Keep bowling and when weather gets good start golfing.    . Patient Stated     04/09/2019, to be a better person       Fall Risk Fall Risk  04/09/2019 09/24/2018 03/26/2018  Falls in the past year? 0 0 0  Risk for fall due to : - - Medication side effect  Follow up Falls evaluation completed;Education provided;Falls prevention discussed - -   Is the patient's home free of loose throw rugs in walkways, pet beds, electrical cords, etc?   yes      Grab bars in the bathroom? no      Handrails on the stairs?   yes      Adequate lighting?   yes  Timed Get Up  and Go Performed: n/a  Depression Screen PHQ 2/9 Scores 04/09/2019 03/26/2018  PHQ - 2 Score 0 0  PHQ- 9 Score - 0    Cognitive Function     6CIT Screen 04/09/2019 03/26/2018  What Year? 0 points 0 points  What month? 0 points 0 points  What time? 0 points 0 points  Count back from 20 0 points 0 points  Months in reverse 0 points 0 points  Repeat phrase 4 points 0 points  Total Score 4 0    Immunization History  Administered Date(s) Administered  . Influenza, High Dose Seasonal PF 11/17/2017  . Pneumococcal Conjugate-13 04/05/2018  . Pneumococcal Polysaccharide-23 01/10/2015    Qualifies for Shingles Vaccine? yes  Screening Tests Health Maintenance  Topic Date Due  . COLONOSCOPY  07/31/2022  . TETANUS/TDAP  11/20/2023  . INFLUENZA VACCINE  Completed  . Hepatitis C Screening  Completed  . PNA vac Low Risk Adult  Completed   Cancer Screenings: Lung: Low Dose CT Chest recommended if Age 87-80 years, 30 pack-year currently smoking OR have quit w/in 15years. Patient does not qualify. Colorectal: up to date  Additional Screenings:  Hepatitis C Screening:05/07/2012      Plan:    Patient just wants to be a better person.  I have personally reviewed and noted the following in the patient's chart:   . Medical and social history . Use of alcohol,  tobacco or illicit drugs  . Current medications and supplements . Functional ability and status . Nutritional status . Physical activity . Advanced directives . List of other physicians . Hospitalizations, surgeries, and ER visits in previous 12 months . Vitals . Screenings to include cognitive, depression, and falls . Referrals and appointments  In addition, I have reviewed and discussed with patient certain preventive protocols, quality metrics, and best practice recommendations. A written personalized care plan for preventive services as well as general preventive health recommendations were provided to patient.     Kellie Simmering, LPN  [id number]

## 2019-04-10 LAB — LIPID PANEL
Chol/HDL Ratio: 3 ratio (ref 0.0–5.0)
Cholesterol, Total: 133 mg/dL (ref 100–199)
HDL: 45 mg/dL (ref >39)
LDL Chol Calc (NIH): 78 mg/dL (ref 0–99)
Triglycerides: 42 mg/dL (ref 0–149)
VLDL Cholesterol Cal: 10 mg/dL (ref 5–40)

## 2019-04-10 LAB — CMP14+EGFR
ALT: 13 IU/L (ref 0–44)
AST: 18 IU/L (ref 0–40)
Albumin/Globulin Ratio: 2.4 — ABNORMAL HIGH (ref 1.2–2.2)
Albumin: 4.7 g/dL (ref 3.8–4.8)
Alkaline Phosphatase: 63 IU/L (ref 39–117)
BUN/Creatinine Ratio: 16 (ref 10–24)
BUN: 19 mg/dL (ref 8–27)
Bilirubin Total: 0.5 mg/dL (ref 0.0–1.2)
CO2: 22 mmol/L (ref 20–29)
Calcium: 9.4 mg/dL (ref 8.6–10.2)
Chloride: 104 mmol/L (ref 96–106)
Creatinine, Ser: 1.16 mg/dL (ref 0.76–1.27)
GFR calc Af Amer: 73 mL/min/{1.73_m2} (ref >59)
GFR calc non Af Amer: 63 mL/min/{1.73_m2} (ref >59)
Globulin, Total: 2 g/dL (ref 1.5–4.5)
Glucose: 101 mg/dL — ABNORMAL HIGH (ref 65–99)
Potassium: 4.8 mmol/L (ref 3.5–5.2)
Sodium: 141 mmol/L (ref 134–144)
Total Protein: 6.7 g/dL (ref 6.0–8.5)

## 2019-04-10 LAB — T4, FREE: Free T4: 1.61 ng/dL (ref 0.82–1.77)

## 2019-04-10 LAB — TSH: TSH: 0.681 u[IU]/mL (ref 0.450–4.500)

## 2019-04-10 NOTE — Progress Notes (Signed)
This visit occurred during the SARS-CoV-2 public health emergency.  Safety protocols were in place, including screening questions prior to the visit, additional usage of staff PPE, and extensive cleaning of exam room while observing appropriate contact time as indicated for disinfecting solutions.  Subjective:     Patient ID: Scott Hinton , male    DOB: [birth year] , 69 y.o.   MRN: [id number]   Chief Complaint  Patient presents with  . Hyperlipidemia    HPI  He is here today for a cholesterol check.  He reports compliance with meds. He feels well. Reports walking regularly for exercise.   Hyperlipidemia This is a chronic problem. The current episode started more than 1 year ago. The problem is controlled. Exacerbating diseases include hypothyroidism. Current antihyperlipidemic treatment includes statins. The current treatment provides moderate improvement of lipids. Risk factors for coronary artery disease include male sex.     Past Medical History:  Diagnosis Date  . Hypercholesterolemia   . Sleep apnea with use of continuous positive airway pressure (CPAP) 2010  . Thyroid disease      Family History  Problem Relation Age of Onset  . Cancer Mother   . Heart disease Father   . Emphysema Father   . Coronary artery disease Father   . Stroke Sister   . Pneumonia Brother   . Stroke Brother   . Heart disease Sister   . Epilepsy Brother      Current Outpatient Medications:  .  aspirin 81 MG chewable tablet, Chew 81 mg by mouth daily., Disp: , Rfl:  .  atorvastatin (LIPITOR) 10 MG tablet, TAKE 1 TABLET BY MOUTH EVERY DAY, Disp: 90 tablet, Rfl: 1 .  cholecalciferol (VITAMIN D) 1000 units tablet, Take 2,000 Units by mouth daily., Disp: , Rfl:  .  fenofibrate (TRICOR) 48 MG tablet, Take 1 tablet (48 mg total) by mouth daily., Disp: 90 tablet, Rfl: 2 .  levothyroxine (SYNTHROID) 100 MCG tablet, TAKE 1 TABLET BY MOUTH DAILY (Patient taking differently: Takes 1/2 tab on Sundays.),  Disp: 30 tablet, Rfl: 4 .  levothyroxine (SYNTHROID) 88 MCG tablet, TAKE 1 TABLET BY MOUTH EVERY DAY ALONG WITH 100 MCG (Patient taking differently: Monday - Saturday), Disp: 90 tablet, Rfl: 1 .  meclizine (ANTIVERT) 25 MG tablet, Take 1 tablet (25 mg total) by mouth 3 (three) times daily as needed for dizziness (mild dizziness)., Disp: 30 tablet, Rfl: 1 .  Multiple Vitamin (MULTIVITAMIN) tablet, Take 1 tablet by mouth daily., Disp: , Rfl:  .  omeprazole (PRILOSEC) 40 MG capsule, TAKE 1 CAPSULE BY MOUTH EVERY DAY, Disp: 90 capsule, Rfl: 1 .  prednisoLONE acetate (PRED FORTE) 1 % ophthalmic suspension, SMARTSIG:4 Drop(s) In Ear(s) Twice Daily PRN, Disp: , Rfl:    No Known Allergies   Review of Systems  Constitutional: Negative.   Respiratory: Negative.   Cardiovascular: Negative.   Gastrointestinal: Negative.   Neurological: Negative.   Psychiatric/Behavioral: Negative.      Today's Vitals   04/09/19 1054  BP: 106/62  Pulse: (!) 47  Temp: 97.7 F (36.5 C)  TempSrc: Oral  Weight: 174 lb (78.9 kg)  Height: 5' 8.4" (1.737 m)  PainSc: 0-No pain   Body mass index is 26.15 kg/m.   Objective:  Physical Exam Vitals and nursing note reviewed.  Constitutional:      Appearance: Normal appearance.  Cardiovascular:     Rate and Rhythm: Normal rate and regular rhythm.     Heart sounds: Normal heart sounds.  Pulmonary:  Effort: Pulmonary effort is normal.     Breath sounds: Normal breath sounds.  Skin:    General: Skin is warm.  Neurological:     General: No focal deficit present.     Mental Status: He is alert.  Psychiatric:        Mood and Affect: Mood normal.         Assessment And Plan:     1. Mixed hyperlipidemia  Chronic. I will check a fasting lipid panel and LFTs. He reports compliance with meds. He is congratulated on his regular walking regimen and advised to avoid fried foods.   - Lipid panel  2. Primary hypothyroidism  I will check thyroid panel and  adjust meds as needed.  - TSH - T4, Free  3. Overweight with body mass index (BMI) of 26 to 26.9 in adult  His weight is stable for his demographics. He is encouraged to continue with his regular exercise program. He will rto in six months for his next physical exam.   4. Drug therapy  - CMP14+EGFR  5. Bradycardia   Chronic. He is asymptomatic. He is able to walk 3 miles without any difficulty. He is encouraged to notify me if he develops worsening fatigue, dizziness, etc. He verbalizes understanding of the information that was given to him.   [name], MD    THE PATIENT IS ENCOURAGED TO PRACTICE SOCIAL DISTANCING DUE TO THE COVID-19 PANDEMIC.

## 2019-04-11 DIAGNOSIS — Z23 Encounter for immunization: Secondary | ICD-10-CM | POA: Diagnosis not present

## 2019-05-09 DIAGNOSIS — Z23 Encounter for immunization: Secondary | ICD-10-CM | POA: Diagnosis not present

## 2019-06-03 ENCOUNTER — Other Ambulatory Visit: Payer: Self-pay | Admitting: Internal Medicine

## 2019-06-10 ENCOUNTER — Encounter: Payer: Self-pay | Admitting: Internal Medicine

## 2019-06-11 ENCOUNTER — Encounter (HOSPITAL_BASED_OUTPATIENT_CLINIC_OR_DEPARTMENT_OTHER): Payer: Self-pay | Admitting: *Deleted

## 2019-06-11 ENCOUNTER — Other Ambulatory Visit: Payer: Self-pay

## 2019-06-11 ENCOUNTER — Emergency Department (HOSPITAL_BASED_OUTPATIENT_CLINIC_OR_DEPARTMENT_OTHER): Payer: Medicare Other

## 2019-06-11 ENCOUNTER — Ambulatory Visit: Payer: Medicare Other | Admitting: Internal Medicine

## 2019-06-11 ENCOUNTER — Emergency Department (HOSPITAL_BASED_OUTPATIENT_CLINIC_OR_DEPARTMENT_OTHER)
Admission: EM | Admit: 2019-06-11 | Discharge: 2019-06-11 | Disposition: A | Discharge status: Home / Self Care | Payer: Medicare Other | Attending: Emergency Medicine | Admitting: Emergency Medicine

## 2019-06-11 DIAGNOSIS — Z7982 Long term (current) use of aspirin: Secondary | ICD-10-CM | POA: Insufficient documentation

## 2019-06-11 DIAGNOSIS — Z79899 Other long term (current) drug therapy: Secondary | ICD-10-CM | POA: Insufficient documentation

## 2019-06-11 DIAGNOSIS — E039 Hypothyroidism, unspecified: Secondary | ICD-10-CM | POA: Diagnosis not present

## 2019-06-11 DIAGNOSIS — R7989 Other specified abnormal findings of blood chemistry: Secondary | ICD-10-CM | POA: Diagnosis not present

## 2019-06-11 DIAGNOSIS — R251 Tremor, unspecified: Secondary | ICD-10-CM | POA: Insufficient documentation

## 2019-06-11 DIAGNOSIS — R1084 Generalized abdominal pain: Secondary | ICD-10-CM

## 2019-06-11 DIAGNOSIS — R079 Chest pain, unspecified: Secondary | ICD-10-CM | POA: Insufficient documentation

## 2019-06-11 DIAGNOSIS — R197 Diarrhea, unspecified: Secondary | ICD-10-CM | POA: Insufficient documentation

## 2019-06-11 DIAGNOSIS — R55 Syncope and collapse: Secondary | ICD-10-CM | POA: Diagnosis present

## 2019-06-11 LAB — COMPREHENSIVE METABOLIC PANEL
ALT: 15 U/L (ref 0–44)
AST: 19 U/L (ref 15–41)
Albumin: 4.6 g/dL (ref 3.5–5.0)
Alkaline Phosphatase: 58 U/L (ref 38–126)
Anion gap: 9 (ref 5–15)
BUN: 18 mg/dL (ref 8–23)
CO2: 25 mmol/L (ref 22–32)
Calcium: 9.8 mg/dL (ref 8.9–10.3)
Chloride: 105 mmol/L (ref 98–111)
Creatinine, Ser: 0.99 mg/dL (ref 0.61–1.24)
GFR calc Af Amer: >60 mL/min (ref >60)
GFR calc non Af Amer: >60 mL/min (ref >60)
Glucose, Bld: 120 mg/dL — ABNORMAL HIGH (ref 70–99)
Potassium: 4.2 mmol/L (ref 3.5–5.1)
Sodium: 139 mmol/L (ref 135–145)
Total Bilirubin: 0.6 mg/dL (ref 0.3–1.2)
Total Protein: 7.3 g/dL (ref 6.5–8.1)

## 2019-06-11 LAB — URINALYSIS, ROUTINE W REFLEX MICROSCOPIC
Bilirubin Urine: NEGATIVE
Glucose, UA: NEGATIVE mg/dL
Hgb urine dipstick: NEGATIVE
Ketones, ur: NEGATIVE mg/dL
Leukocytes,Ua: NEGATIVE
Nitrite: NEGATIVE
Protein, ur: NEGATIVE mg/dL
Specific Gravity, Urine: 1.01 (ref 1.005–1.030)
pH: 7 (ref 5.0–8.0)

## 2019-06-11 LAB — CBC
HCT: 47.4 % (ref 39.0–52.0)
Hemoglobin: 16.4 g/dL (ref 13.0–17.0)
MCH: 34.4 pg — ABNORMAL HIGH (ref 26.0–34.0)
MCHC: 34.6 g/dL (ref 30.0–36.0)
MCV: 99.4 fL (ref 80.0–100.0)
Platelets: 166 10*3/uL (ref 150–400)
RBC: 4.77 MIL/uL (ref 4.22–5.81)
RDW: 12.2 % (ref 11.5–15.5)
WBC: 6.2 10*3/uL (ref 4.0–10.5)
nRBC: 0 % (ref 0.0–0.2)

## 2019-06-11 LAB — TROPONIN I (HIGH SENSITIVITY)
Troponin I (High Sensitivity): 3 ng/L (ref <18)
Troponin I (High Sensitivity): 4 ng/L (ref <18)

## 2019-06-11 LAB — LIPASE, BLOOD: Lipase: 26 U/L (ref 11–51)

## 2019-06-11 LAB — D-DIMER, QUANTITATIVE: D-Dimer, Quant: 0.85 ug/mL-FEU — ABNORMAL HIGH (ref 0.00–0.50)

## 2019-06-11 LAB — TSH: TSH: 1.255 u[IU]/mL (ref 0.350–4.500)

## 2019-06-11 MED ORDER — IOHEXOL 350 MG/ML SOLN
100.0000 mL | Freq: Once | INTRAVENOUS | Status: AC | PRN
  Administered 2019-06-11: 100 mL via INTRAVENOUS

## 2019-06-11 MED ORDER — SODIUM CHLORIDE 0.9% FLUSH
3.0000 mL | Freq: Once | INTRAVENOUS | Status: DC
  Filled 2019-06-11: qty 3

## 2019-06-11 NOTE — ED Triage Notes (Signed)
Abdominal pain and diarrhea last night. States since getting to Covid Vaccine in February he has been having shaking and periods of feeling bad.

## 2019-06-11 NOTE — Discharge Instructions (Signed)
Your labwork was reassuring today. Your TSH has not returned yet; this may take several more hours to days to result. Please log into MyChart to check the results that way and discuss with your PCP.   Return to the ED for any worsening symptoms including worsening chest pain, passing out, return of abdominal pain, excessive vomiting, or any other concerning symptoms

## 2019-06-11 NOTE — ED Provider Notes (Signed)
Medical screening examination/treatment/procedure(s) were conducted as a shared visit with non-physician practitioner(s) and myself.  I personally evaluated the patient during the encounter.  EKG Interpretation  Date/Time:  Thursday June 11 2019 16:30:09 EDT Ventricular Rate:  50 PR Interval:    QRS Duration: 104 QT Interval:  435 QTC Calculation: 397 R Axis:   -27 Text Interpretation: Sinus rhythm Borderline left axis deviation Abnormal R-wave progression, early transition No significant change since last tracing Confirmed by Fredia Sorrow [phone]) on 06/11/2019 4:33:06 PM   Patient seen by me along with the physician assistant.  Patient presenting with multiple complaints.  Including abdominal pain diarrhea last night states that since he has Covid vaccine in February has been having shaking periods and feeling bad.  Patient does have a history of hypercholesterol.  Thyroid disease.  Is on thyroid medicine.  And sleep apnea on CPAP.  Patient also developed some chest pain here as well.  EKG showed no acute changes delta troponins without any significant abnormalities.  Rest of work-up without any acute findings.  Patient nontoxic no acute distress.  Vital signs are very normal.  Is temp is 98.2 heart rate 57 blood pressure 131/83 oxygen saturation is 99% on room air.  Patient did have thyroid-stimulating hormone ordered results not back.  Will need follow-up with his primary care who is Bryon Lions for that.  Otherwise patient safe for discharge home and follow-up with primary care doctor.  Patient did have elevated D-dimer so had CT angio done.  That had no acute findings.  Patient's EKG had a Lex abscess deviation but no other acute findings.  Patient's abdomen is soft and benign.  CT of abdomen not required.  Results for orders placed or performed during the hospital encounter of 06/11/19  Lipase, blood  Result Value Ref Range   Lipase 26 11 - 51 U/L  Comprehensive metabolic panel   Result Value Ref Range   Sodium 139 135 - 145 mmol/L   Potassium 4.2 3.5 - 5.1 mmol/L   Chloride 105 98 - 111 mmol/L   CO2 25 22 - 32 mmol/L   Glucose, Bld 120 (H) 70 - 99 mg/dL   BUN 18 8 - 23 mg/dL   Creatinine, Ser 0.99 0.61 - 1.24 mg/dL   Calcium 9.8 8.9 - 10.3 mg/dL   Total Protein 7.3 6.5 - 8.1 g/dL   Albumin 4.6 3.5 - 5.0 g/dL   AST 19 15 - 41 U/L   ALT 15 0 - 44 U/L   Alkaline Phosphatase 58 38 - 126 U/L   Total Bilirubin 0.6 0.3 - 1.2 mg/dL   GFR calc non Af Amer >60 >60 mL/min   GFR calc Af Amer >60 >60 mL/min   Anion gap 9 5 - 15  CBC  Result Value Ref Range   WBC 6.2 4.0 - 10.5 K/uL   RBC 4.77 4.22 - 5.81 MIL/uL   Hemoglobin 16.4 13.0 - 17.0 g/dL   HCT 47.4 39.0 - 52.0 %   MCV 99.4 80.0 - 100.0 fL   MCH 34.4 (H) 26.0 - 34.0 pg   MCHC 34.6 30.0 - 36.0 g/dL   RDW 12.2 11.5 - 15.5 %   Platelets 166 150 - 400 K/uL   nRBC 0.0 0.0 - 0.2 %  Urinalysis, Routine w reflex microscopic  Result Value Ref Range   Color, Urine YELLOW YELLOW   APPearance CLEAR CLEAR   Specific Gravity, Urine 1.010 1.005 - 1.030   pH 7.0 5.0 - 8.0  Glucose, UA NEGATIVE NEGATIVE mg/dL   Hgb urine dipstick NEGATIVE NEGATIVE   Bilirubin Urine NEGATIVE NEGATIVE   Ketones, ur NEGATIVE NEGATIVE mg/dL   Protein, ur NEGATIVE NEGATIVE mg/dL   Nitrite NEGATIVE NEGATIVE   Leukocytes,Ua NEGATIVE NEGATIVE  D-dimer, quantitative  Result Value Ref Range   D-Dimer, Quant 0.85 (H) 0.00 - 0.50 ug/mL-FEU  Troponin I (High Sensitivity)  Result Value Ref Range   Troponin I (High Sensitivity) 3 <18 ng/L  Troponin I (High Sensitivity)  Result Value Ref Range   Troponin I (High Sensitivity) 4 <18 ng/L   CT Angio Chest PE W/Cm &/Or Wo Cm  Result Date: 06/11/2019 CLINICAL DATA:  PE suspected, positive D-dimer EXAM: CT ANGIOGRAPHY CHEST WITH CONTRAST TECHNIQUE: Multidetector CT imaging of the chest was performed using the standard protocol during bolus administration of intravenous contrast. Multiplanar CT  image reconstructions and MIPs were obtained to evaluate the vascular anatomy. CONTRAST:  129mL OMNIPAQUE IOHEXOL 350 MG/ML SOLN COMPARISON:  Radiograph 06/11/2019 FINDINGS: Cardiovascular: Satisfactory opacification of pulmonary arteries with distal segmental and subsegmental evaluation limited by some mild respiratory motion artifact most pronounced towards the lung bases. No visible pulmonary arterial filling defects are identified. Central pulmonary arteries are normal caliber. Cardiac size is upper limits normal. No pericardial effusion. Atherosclerotic plaque within the normal caliber aorta. Normal 3 vessel branching of the aortic arch. Proximal great vessels normally opacified. Minimal plaque in the distal brachiocephalic artery. Mediastinum/Nodes: No mediastinal fluid or gas. Normal thyroid gland and thoracic inlet. No acute abnormality of the trachea or esophagus. No worrisome mediastinal, hilar or axillary adenopathy. Lungs/Pleura: There are dependent and basilar atelectatic changes in both lungs. No focal consolidative opacity or convincing features of edema. No pneumothorax or visible effusion. No suspicious pulmonary nodules or masses. Upper Abdomen: No acute abnormalities present in the visualized portions of the upper abdomen. Musculoskeletal: Multilevel degenerative changes are present in the imaged portions of the spine. No chest wall mass or suspicious bone lesions identified. Review of the MIP images confirms the above findings. IMPRESSION: 1. No acute pulmonary arterial filling defects to suggest pulmonary embolism. 2. Dependent and basilar atelectatic changes in both lungs. No other acute cardiopulmonary abnormality. 3. Aortic Atherosclerosis (ICD10-I70.0). Electronically Signed   By: Lovena Le M.D.   On: 06/11/2019 17:31   DG Chest Port 1 View  Result Date: 06/11/2019 CLINICAL DATA:  Chest pain EXAM: PORTABLE CHEST 1 VIEW COMPARISON:  01/27/2017 FINDINGS: The heart size and mediastinal  contours are within normal limits. Both lungs are clear. The visualized skeletal structures are unremarkable. IMPRESSION: No active disease. Electronically Signed   By: Inez Catalina M.D.   On: 06/11/2019 15:54      Fredia Sorrow, MD 06/11/19 1816

## 2019-06-11 NOTE — ED Provider Notes (Signed)
[facility] Provider Note   CSN: [id number] Arrival date & time: 06/11/19  1254     History Chief Complaint  Patient presents with  . Abdominal Pain    Scott Hinton is a 69 y.o. male with PMHx hypothyroidism, sleep apnea with CPAP who presents to the ED today with complaint of near syncope that occurred earlier this morning.  She did not endorses that he ate "a lot" of pizza last night.  Shortly afterwards he began having diffuse abdominal pain and excessive watery diarrhea.  Reports this morning he woke up and felt improved however when he got up he felt like he was going to pass out.  Patient states he has been able to eat something since then without any emesis.  He does report that due to the excessive watery diarrhea he had yesterday that he noticed bright red blood in his stool today, he reports history of external hemorrhoids and states that whenever he wipes excessively he will have some bright red blood.  Denies melena.   She also complains of intermittent shaking episodes for "a while."  He states that this typically occurs when his thyroid level is off.  Patient with history of hypothyroidism and had his TSH checked in February.  He states that they decreased his dosage since then.  He is currently on 188 mcg.  She reports he was excessively shaking yesterday and believes that his TSH is off.  He would like this checked.   While in the ED today patient began having an "itching" sensation to his left chest that radiates into his axilla.  He states that he walks his dog quite frequently and thinks may be she was pulling on the leash causing some pain in his chest.  Denies shortness of breath. No hx of MI. FHx of CAD however reports father and brother were older in age ~ 49s. Pt is a former smoker; he reports he quit in the 1980s. No hx of DVT/PE.   The history is provided by the patient, the spouse and medical records.    HPI: A 68 year old patient  presents for evaluation of chest pain. Initial onset of pain was approximately 3-6 hours ago. The patient's chest pain is not worse with exertion. The patient's chest pain is middle- or left-sided, is not well-localized, is not described as heaviness/pressure/tightness, is not sharp and does not radiate to the arms/jaw/neck. The patient does not complain of nausea and denies diaphoresis. The patient has no history of stroke, has no history of peripheral artery disease, has not smoked in the past 90 days, denies any history of treated diabetes, has no relevant family history of coronary artery disease (first degree relative at less than age 52), is not hypertensive, has no history of hypercholesterolemia and does not have an elevated BMI (>=30).   Past Medical History:  Diagnosis Date  . Hypercholesterolemia   . Sleep apnea with use of continuous positive airway pressure (CPAP) 2010  . Thyroid disease     Patient Active Problem List   Diagnosis Date Noted  . Primary hypothyroidism 03/26/2018  . Mixed hyperlipidemia 03/26/2018  . Gastroesophageal reflux disease without esophagitis 03/26/2018  . OSA on CPAP 03/26/2018    Past Surgical History:  Procedure Laterality Date  . NASAL POLYP SURGERY  1984       Family History  Problem Relation Age of Onset  . Cancer Mother   . Heart disease Father   . Emphysema Father   .  Coronary artery disease Father   . Stroke Sister   . Pneumonia Brother   . Stroke Brother   . Heart disease Sister   . Epilepsy Brother     Social History   Tobacco Use  . Smoking status: Never Smoker  . Smokeless tobacco: Never Used  Substance Use Topics  . Alcohol use: No  . Drug use: No    Home Medications Prior to Admission medications   Medication Sig Start Date End Date Taking? Authorizing Provider  aspirin 81 MG chewable tablet Chew 81 mg by mouth daily.    [provider]  atorvastatin (LIPITOR) 10 MG tablet TAKE 1 TABLET BY MOUTH EVERY DAY  03/02/19   Glendale Chard, MD  cholecalciferol (VITAMIN D) 1000 units tablet Take 2,000 Units by mouth daily.    [provider]  fenofibrate (TRICOR) 48 MG tablet Take 1 tablet (48 mg total) by mouth daily. 01/05/19   Glendale Chard, MD  levothyroxine (SYNTHROID) 100 MCG tablet TAKE 1 TABLET BY MOUTH DAILY 06/04/19   Glendale Chard, MD  levothyroxine (SYNTHROID) 88 MCG tablet TAKE 1 TABLET BY MOUTH EVERY DAY ALONG WITH 100 MCG 06/04/19   Glendale Chard, MD  meclizine (ANTIVERT) 25 MG tablet Take 1 tablet (25 mg total) by mouth 3 (three) times daily as needed for dizziness (mild dizziness). 02/09/19   Glendale Chard, MD  Multiple Vitamin (MULTIVITAMIN) tablet Take 1 tablet by mouth daily.    [provider]  omeprazole (PRILOSEC) 40 MG capsule TAKE 1 CAPSULE BY MOUTH EVERY DAY 02/11/19   Glendale Chard, MD  prednisoLONE acetate (PRED FORTE) 1 % ophthalmic suspension SMARTSIG:4 Drop(s) In Ear(s) Twice Daily PRN 03/02/19   [provider]    Allergies    Patient has no known allergies.  Review of Systems   Review of Systems  Constitutional: Negative for chills and fever.  Respiratory: Negative for cough and shortness of breath.   Cardiovascular: Positive for chest pain.  Gastrointestinal: Positive for abdominal pain (resolved) and diarrhea (resolved). Negative for nausea and vomiting.  Neurological: Positive for tremors. Negative for weakness, numbness and headaches.  All other systems reviewed and are negative.   Physical Exam Updated Vital Signs BP 125/71   Pulse (!) 53   Temp 98.2 F (36.8 C) (Oral)   Resp 13   Ht 5\' 8"  (1.727 m)   Wt 75.8 kg   SpO2 97%   BMI 25.39 kg/m   Physical Exam Vitals and nursing note reviewed.  Constitutional:      Appearance: He is not ill-appearing or diaphoretic.  HENT:     Head: Normocephalic and atraumatic.  Eyes:     Conjunctiva/sclera: Conjunctivae normal.  Cardiovascular:     Rate and Rhythm: Normal rate and regular  rhythm.     Heart sounds: Normal heart sounds.  Pulmonary:     Effort: Pulmonary effort is normal.     Breath sounds: Normal breath sounds. No wheezing, rhonchi or rales.  Abdominal:     Palpations: Abdomen is soft.     Tenderness: There is no abdominal tenderness. There is no right CVA tenderness, left CVA tenderness, guarding or rebound.  Musculoskeletal:     Cervical back: Neck supple.  Skin:    General: Skin is warm and dry.  Neurological:     Mental Status: He is alert.     Comments: CN 3-12 grossly intact A&O x4 GCS 15 Sensation and strength intact Gait nonataxic including with tandem walking Coordination with finger-to-nose WNL  Neg romberg, neg pronator drift     ED Results / Procedures / Treatments   Labs (all labs ordered are listed, but only abnormal results are displayed) Labs Reviewed  COMPREHENSIVE METABOLIC PANEL - Abnormal; Notable for the following components:      Result Value   Glucose, Bld 120 (*)    All other components within normal limits  CBC - Abnormal; Notable for the following components:   MCH 34.4 (*)    All other components within normal limits  D-DIMER, QUANTITATIVE (NOT AT [facility]) - Abnormal; Notable for the following components:   D-Dimer, Quant 0.85 (*)    All other components within normal limits  LIPASE, BLOOD  URINALYSIS, ROUTINE W REFLEX MICROSCOPIC  TSH  TROPONIN I (HIGH SENSITIVITY)  TROPONIN I (HIGH SENSITIVITY)    EKG EKG Interpretation  Date/Time:  Thursday June 11 2019 16:30:09 EDT Ventricular Rate:  50 PR Interval:    QRS Duration: 104 QT Interval:  435 QTC Calculation: 397 R Axis:   -27 Text Interpretation: Sinus rhythm Borderline left axis deviation Abnormal R-wave progression, early transition No significant change since last tracing Confirmed by Fredia Sorrow [phone]) on 06/11/2019 4:33:06 PM   Radiology CT Angio Chest PE W/Cm &/Or Wo Cm  Result Date: 06/11/2019 CLINICAL DATA:  PE suspected, positive D-dimer  EXAM: CT ANGIOGRAPHY CHEST WITH CONTRAST TECHNIQUE: Multidetector CT imaging of the chest was performed using the standard protocol during bolus administration of intravenous contrast. Multiplanar CT image reconstructions and MIPs were obtained to evaluate the vascular anatomy. CONTRAST:  124mL OMNIPAQUE IOHEXOL 350 MG/ML SOLN COMPARISON:  Radiograph 06/11/2019 FINDINGS: Cardiovascular: Satisfactory opacification of pulmonary arteries with distal segmental and subsegmental evaluation limited by some mild respiratory motion artifact most pronounced towards the lung bases. No visible pulmonary arterial filling defects are identified. Central pulmonary arteries are normal caliber. Cardiac size is upper limits normal. No pericardial effusion. Atherosclerotic plaque within the normal caliber aorta. Normal 3 vessel branching of the aortic arch. Proximal great vessels normally opacified. Minimal plaque in the distal brachiocephalic artery. Mediastinum/Nodes: No mediastinal fluid or gas. Normal thyroid gland and thoracic inlet. No acute abnormality of the trachea or esophagus. No worrisome mediastinal, hilar or axillary adenopathy. Lungs/Pleura: There are dependent and basilar atelectatic changes in both lungs. No focal consolidative opacity or convincing features of edema. No pneumothorax or visible effusion. No suspicious pulmonary nodules or masses. Upper Abdomen: No acute abnormalities present in the visualized portions of the upper abdomen. Musculoskeletal: Multilevel degenerative changes are present in the imaged portions of the spine. No chest wall mass or suspicious bone lesions identified. Review of the MIP images confirms the above findings. IMPRESSION: 1. No acute pulmonary arterial filling defects to suggest pulmonary embolism. 2. Dependent and basilar atelectatic changes in both lungs. No other acute cardiopulmonary abnormality. 3. Aortic Atherosclerosis (ICD10-I70.0). Electronically Signed   By: Lovena Le  M.D.   On: 06/11/2019 17:31   DG Chest Port 1 View  Result Date: 06/11/2019 CLINICAL DATA:  Chest pain EXAM: PORTABLE CHEST 1 VIEW COMPARISON:  01/27/2017 FINDINGS: The heart size and mediastinal contours are within normal limits. Both lungs are clear. The visualized skeletal structures are unremarkable. IMPRESSION: No active disease. Electronically Signed   By: Inez Catalina M.D.   On: 06/11/2019 15:54    Procedures Procedures (including critical care time)  Medications Ordered in ED Medications  iohexol (OMNIPAQUE) 350 MG/ML injection 100 mL (100 mLs Intravenous Contrast Given 06/11/19 1654)    ED Course  I  have reviewed the triage vital signs and the nursing notes.  Pertinent labs & imaging results that were available during my care of the patient were reviewed by me and considered in my medical decision making (see chart for details).  Clinical Course as of Jun 11 1814  Thu Jun 11, 2019  1553 D-Dimer, Quant(!): 0.85 [MV]    Clinical Course User Index [MV] Eustaquio Maize, PA-C   MDM Rules/Calculators/A&P HEAR Score: 3                    69 year old male presents to the ED with multiple complaints.  Reports near syncope earlier today after diffuse lower abdominal pain and diarrhea secondary to eating pizza last night.  No current abdominal pain or diarrhea.  Also complaining of diffuse shakiness which he associates with his TSH being off.  Did recently have his hypothyroidism decreased in February and has not had his TSH checked since then.  He is convinced that his TSH is off for him.  He also reports that he started having chest pain while in the ED today.  Patient has no focal neuro deficits on exam today.  No focal abdominal tenderness to palpation.  Abdominal lab work was obtained prior to being seen, no acute findings.  Will work-up for ACS at this time and given age cannot PERC out.  Will obtain D-dimer.  Will add on TSH however this is send out lab and suspect that this may  take several hours if not days to come back.  Patient advised to follow-up with his PCP regarding this.   Initial trop of 3. Will repeat D dimer elevated at 0.85 and cannot age adjust; will obtain CTA  CTA negative at this time Repeat troponin of 4. Heart score of 3; low risk for MI.   TSH still has not returned however do not feel this will change treatment course. Pt advised to follow results on MyChart and to discuss with PCP. Dr. Rogene Houston has evaluated patient and agrees with plan to discharge.   This note was prepared using Dragon voice recognition software and may include unintentional dictation errors due to the inherent limitations of voice recognition software.  Final Clinical Impression(s) / ED Diagnoses Final diagnoses:  Generalized abdominal pain  Hypothyroidism, unspecified type  Nonspecific chest pain    Rx / DC Orders ED Discharge Orders    None       Discharge Instructions     Your labwork was reassuring today. Your TSH has not returned yet; this may take several more hours to days to result. Please log into MyChart to check the results that way and discuss with your PCP.   Return to the ED for any worsening symptoms including worsening chest pain, passing out, return of abdominal pain, excessive vomiting, or any other concerning symptoms       Eustaquio Maize, PA-C 06/11/19 1816    Fredia Sorrow, MD 06/17/19 [phone]

## 2019-06-12 ENCOUNTER — Encounter: Payer: Self-pay | Admitting: Internal Medicine

## 2019-06-16 ENCOUNTER — Other Ambulatory Visit: Payer: Self-pay

## 2019-06-16 ENCOUNTER — Ambulatory Visit (INDEPENDENT_AMBULATORY_CARE_PROVIDER_SITE_OTHER): Payer: Medicare Other | Admitting: Internal Medicine

## 2019-06-16 ENCOUNTER — Encounter: Payer: Self-pay | Admitting: Internal Medicine

## 2019-06-16 VITALS — BP 118/68 | HR 60 | Temp 98.1°F | Ht 67.4 in | Wt 168.2 lb

## 2019-06-16 DIAGNOSIS — Z712 Person consulting for explanation of examination or test findings: Secondary | ICD-10-CM

## 2019-06-16 DIAGNOSIS — R002 Palpitations: Secondary | ICD-10-CM

## 2019-06-16 DIAGNOSIS — Z09 Encounter for follow-up examination after completed treatment for conditions other than malignant neoplasm: Secondary | ICD-10-CM | POA: Diagnosis not present

## 2019-06-16 DIAGNOSIS — Z8679 Personal history of other diseases of the circulatory system: Secondary | ICD-10-CM

## 2019-06-16 DIAGNOSIS — K580 Irritable bowel syndrome with diarrhea: Secondary | ICD-10-CM

## 2019-06-16 DIAGNOSIS — E039 Hypothyroidism, unspecified: Secondary | ICD-10-CM | POA: Diagnosis not present

## 2019-06-16 DIAGNOSIS — Z6826 Body mass index (BMI) 26.0-26.9, adult: Secondary | ICD-10-CM | POA: Diagnosis not present

## 2019-06-16 NOTE — Progress Notes (Signed)
This visit occurred during the SARS-CoV-2 public health emergency.  Safety protocols were in place, including screening questions prior to the visit, additional usage of staff PPE, and extensive cleaning of exam room while observing appropriate contact time as indicated for disinfecting solutions.  Subjective:     Patient ID: Scott Hinton , male    DOB: 30-Sep-1950 , 69 y.o.   MRN: [id number]   Chief Complaint  Patient presents with  . Follow-up    HPI  Scott Hinton presents for hospital f/u today.  He is a 69 y.o. male with PMHx hypothyroidism, sleep apnea with CPAP who presented to the ED 04/15 with complaint of near syncope that occurred earlier that day.  He stated that he ate "a lot of pizza" night before, which he thinks caused him to feel so bad. Shortly after eating the pizza, he developed diffuse abdominal pain and excessive watery diarrhea.  The morning of his ER presentation,  he woke up and felt improved however when he stood up, he felt like he was going to pass out. He adds his heart was racing as well.   He does report that due to the excessive watery diarrhea he had yesterday that he noticed bright red blood in his stool -  he reports history of external hemorrhoids and occasionally sees blood on TP when wiping.   While in the ED today, patient began having an "itching" sensation to his left chest that radiates into his axilla.  He states that he walks his dog quite frequently and thinks maybe she was pulling on the leash causing some pain in his chest.  Denies shortness of breath. No hx of MI. FHx of CAD however reports father and brother were older in age ~ 25s. Pt is a former smoker; he reports he quit in the 1980s. No hx of DVT/PE.   Since discharge from ER, he states he feels fine. He has not had any recurrence of his symptoms.     Past Medical History:  Diagnosis Date  . Hypercholesterolemia   . Sleep apnea with use of continuous positive airway pressure (CPAP) 2010   . Thyroid disease      Family History  Problem Relation Age of Onset  . Cancer Mother   . Heart disease Father   . Emphysema Father   . Coronary artery disease Father   . Stroke Sister   . Pneumonia Brother   . Stroke Brother   . Heart disease Sister   . Epilepsy Brother      Current Outpatient Medications:  .  aspirin 81 MG chewable tablet, Chew 81 mg by mouth daily., Disp: , Rfl:  .  atorvastatin (LIPITOR) 10 MG tablet, TAKE 1 TABLET BY MOUTH EVERY DAY, Disp: 90 tablet, Rfl: 1 .  cholecalciferol (VITAMIN D) 1000 units tablet, Take 2,000 Units by mouth daily., Disp: , Rfl:  .  fenofibrate (TRICOR) 48 MG tablet, Take 1 tablet (48 mg total) by mouth daily., Disp: 90 tablet, Rfl: 2 .  levothyroxine (SYNTHROID) 100 MCG tablet, TAKE 1 TABLET BY MOUTH DAILY, Disp: 90 tablet, Rfl: 1 .  levothyroxine (SYNTHROID) 88 MCG tablet, TAKE 1 TABLET BY MOUTH EVERY DAY ALONG WITH 100 MCG, Disp: 90 tablet, Rfl: 1 .  meclizine (ANTIVERT) 25 MG tablet, Take 1 tablet (25 mg total) by mouth 3 (three) times daily as needed for dizziness (mild dizziness)., Disp: 30 tablet, Rfl: 1 .  Multiple Vitamin (MULTIVITAMIN) tablet, Take 1 tablet by mouth daily., Disp: , Rfl:  .  omeprazole (PRILOSEC) 40 MG capsule, TAKE 1 CAPSULE BY MOUTH EVERY DAY, Disp: 90 capsule, Rfl: 1 .  prednisoLONE acetate (PRED FORTE) 1 % ophthalmic suspension, SMARTSIG:4 Drop(s) In Ear(s) Twice Daily PRN, Disp: , Rfl:    No Known Allergies   Review of Systems  Constitutional: Negative.   Respiratory: Negative.   Cardiovascular: Positive for palpitations.  Gastrointestinal: Negative.   Neurological: Negative.   Psychiatric/Behavioral: Negative.      Today's Vitals   06/16/19 1451  BP: 118/68  Pulse: 60  Temp: 98.1 F (36.7 C)  TempSrc: Oral  Weight: 168 lb 3.2 oz (76.3 kg)  Height: 5' 7.4" (1.712 m)   Body mass index is 26.03 kg/m.   Objective:  Physical Exam Vitals and nursing note reviewed.  Constitutional:       Appearance: Normal appearance.  Cardiovascular:     Rate and Rhythm: Normal rate and regular rhythm.     Heart sounds: Normal heart sounds.  Pulmonary:     Effort: Pulmonary effort is normal.     Breath sounds: Normal breath sounds.  Skin:    General: Skin is warm.  Neurological:     General: No focal deficit present.     Mental Status: He is alert.  Psychiatric:        Mood and Affect: Mood normal.         Assessment And Plan:     1. Irritable bowel syndrome with diarrhea  ER records reviewed in full detail. He is encouraged to limit his intake of fried, greasy foods which may trigger his sx. If persistent, he may benefit from Viberzi, as long as he has not had cholecystectomy. Pt advised that diarrhea likely precipitated his near syncope due to dehydration and electrolyte imbalance. He is encouraged to keep Pedialyte on hand to drink when needed.   2. Palpitations  Resolved, I will refer him to Cardiology for further evaluation. He is in agreement with this referral. I will check labs as listed below. He may benefit from magnesium supplementation.   - Ambulatory referral to Cardiology - BMP8+EGFR - Magnesium  3. Primary hypothyroidism  TSH results from ER visit reviewed, this is normal. No further testing is needed at this time.   4. Body mass index (BMI) of 26.0-26.9 in adult  His weight is stable for his demographic. He is encouraged to increase his activity as tolerated.   Scott Greenland, MD    THE PATIENT IS ENCOURAGED TO PRACTICE SOCIAL DISTANCING DUE TO THE COVID-19 PANDEMIC.

## 2019-06-17 LAB — BMP8+EGFR
BUN/Creatinine Ratio: 16 (ref 10–24)
BUN: 17 mg/dL (ref 8–27)
CO2: 23 mmol/L (ref 20–29)
Calcium: 9.8 mg/dL (ref 8.6–10.2)
Chloride: 99 mmol/L (ref 96–106)
Creatinine, Ser: 1.05 mg/dL (ref 0.76–1.27)
GFR calc Af Amer: 83 mL/min/{1.73_m2} (ref >59)
GFR calc non Af Amer: 72 mL/min/{1.73_m2} (ref >59)
Glucose: 87 mg/dL (ref 65–99)
Potassium: 4.8 mmol/L (ref 3.5–5.2)
Sodium: 141 mmol/L (ref 134–144)

## 2019-06-17 LAB — MAGNESIUM: Magnesium: 2 mg/dL (ref 1.6–2.3)

## 2019-06-29 NOTE — Patient Instructions (Signed)

## 2019-07-18 ENCOUNTER — Encounter (HOSPITAL_BASED_OUTPATIENT_CLINIC_OR_DEPARTMENT_OTHER): Payer: Self-pay

## 2019-07-18 ENCOUNTER — Emergency Department (HOSPITAL_BASED_OUTPATIENT_CLINIC_OR_DEPARTMENT_OTHER)
Admission: EM | Admit: 2019-07-18 | Discharge: 2019-07-18 | Disposition: A | Discharge status: Home / Self Care | Payer: Medicare Other | Attending: Emergency Medicine | Admitting: Emergency Medicine

## 2019-07-18 ENCOUNTER — Other Ambulatory Visit: Payer: Self-pay

## 2019-07-18 DIAGNOSIS — S70362A Insect bite (nonvenomous), left thigh, initial encounter: Secondary | ICD-10-CM | POA: Diagnosis not present

## 2019-07-18 DIAGNOSIS — Z7982 Long term (current) use of aspirin: Secondary | ICD-10-CM | POA: Insufficient documentation

## 2019-07-18 DIAGNOSIS — Y9389 Activity, other specified: Secondary | ICD-10-CM | POA: Insufficient documentation

## 2019-07-18 DIAGNOSIS — E038 Other specified hypothyroidism: Secondary | ICD-10-CM | POA: Insufficient documentation

## 2019-07-18 DIAGNOSIS — W57XXXA Bitten or stung by nonvenomous insect and other nonvenomous arthropods, initial encounter: Secondary | ICD-10-CM | POA: Insufficient documentation

## 2019-07-18 DIAGNOSIS — Y9289 Other specified places as the place of occurrence of the external cause: Secondary | ICD-10-CM | POA: Insufficient documentation

## 2019-07-18 DIAGNOSIS — Y999 Unspecified external cause status: Secondary | ICD-10-CM | POA: Diagnosis not present

## 2019-07-18 DIAGNOSIS — Z79899 Other long term (current) drug therapy: Secondary | ICD-10-CM | POA: Diagnosis not present

## 2019-07-18 MED ORDER — LIDOCAINE-EPINEPHRINE (PF) 2 %-1:200000 IJ SOLN: 10.0000 mL | Freq: Once | INTRAMUSCULAR | Status: AC

## 2019-07-18 MED ORDER — LIDOCAINE-EPINEPHRINE (PF) 2 %-1:200000 IJ SOLN
INTRAMUSCULAR | Status: AC
  Administered 2019-07-18: 10 mL
  Filled 2019-07-18: qty 10

## 2019-07-18 MED ORDER — DOUBLE ANTIBIOTIC 500-10000 UNIT/GM EX OINT
TOPICAL_OINTMENT | Freq: Two times a day (BID) | CUTANEOUS | Status: DC
  Filled 2019-07-18: qty 28.4

## 2019-07-18 NOTE — ED Triage Notes (Signed)
Pt had a tick bite on his L inner thigh. Pt attempted to removed the tick and believes there are still parts retained under the skin. Area has localized reddness.

## 2019-07-18 NOTE — ED Notes (Signed)
ED Provider at bedside. 

## 2019-07-18 NOTE — ED Notes (Signed)
Bacitracin (ordered ointment not available at this facility, EDP verbally approved use of bacitracin) and bandage applied to wound. DC instructions reviewed. Pt verbalized understanding.

## 2019-07-18 NOTE — ED Provider Notes (Addendum)
[facility] EMERGENCY DEPARTMENT Provider Note   CSN: [id number] Arrival date & time: 07/18/19  1858     History Chief Complaint  Patient presents with  . Tick Removal    Scott Hinton is a 69 y.o. male.  Pt presents to the ED today with a tick stuck in his left leg.  Pt said he noticed the tick and removed it with tweezers.  He does not think it all came out.          Past Medical History:  Diagnosis Date  . Hypercholesterolemia   . Sleep apnea with use of continuous positive airway pressure (CPAP) 2010  . Thyroid disease     Patient Active Problem List   Diagnosis Date Noted  . Primary hypothyroidism 03/26/2018  . Mixed hyperlipidemia 03/26/2018  . Gastroesophageal reflux disease without esophagitis 03/26/2018  . OSA on CPAP 03/26/2018    Past Surgical History:  Procedure Laterality Date  . NASAL POLYP SURGERY  1984       Family History  Problem Relation Age of Onset  . Cancer Mother   . Heart disease Father   . Emphysema Father   . Coronary artery disease Father   . Stroke Sister   . Pneumonia Brother   . Stroke Brother   . Heart disease Sister   . Epilepsy Brother     Social History   Tobacco Use  . Smoking status: Never Smoker  . Smokeless tobacco: Never Used  Substance Use Topics  . Alcohol use: No  . Drug use: No    Home Medications Prior to Admission medications   Medication Sig Start Date End Date Taking? Authorizing Provider  fexofenadine (ALLEGRA) 180 MG tablet Take 180 mg by mouth daily.   Yes [provider]  aspirin 81 MG chewable tablet Chew 81 mg by mouth daily.    [provider]  atorvastatin (LIPITOR) 10 MG tablet TAKE 1 TABLET BY MOUTH EVERY DAY 03/02/19   Glendale Chard, MD  cholecalciferol (VITAMIN D) 1000 units tablet Take 2,000 Units by mouth daily.    [provider]  fenofibrate (TRICOR) 48 MG tablet Take 1 tablet (48 mg total) by mouth daily. 01/05/19   Glendale Chard, MD    levothyroxine (SYNTHROID) 100 MCG tablet TAKE 1 TABLET BY MOUTH DAILY 06/04/19   Glendale Chard, MD  levothyroxine (SYNTHROID) 88 MCG tablet TAKE 1 TABLET BY MOUTH EVERY DAY ALONG WITH 100 MCG 06/04/19   Glendale Chard, MD  meclizine (ANTIVERT) 25 MG tablet Take 1 tablet (25 mg total) by mouth 3 (three) times daily as needed for dizziness (mild dizziness). 02/09/19   Glendale Chard, MD  Multiple Vitamin (MULTIVITAMIN) tablet Take 1 tablet by mouth daily.    [provider]  omeprazole (PRILOSEC) 40 MG capsule TAKE 1 CAPSULE BY MOUTH EVERY DAY 02/11/19   Glendale Chard, MD  prednisoLONE acetate (PRED FORTE) 1 % ophthalmic suspension SMARTSIG:4 Drop(s) In Ear(s) Twice Daily PRN 03/02/19   [provider]    Allergies    Patient has no known allergies.  Review of Systems   Review of Systems  Skin:       Tick stuck in left upper thigh  All other systems reviewed and are negative.   Physical Exam Updated Vital Signs BP 135/68 (BP Location: Left Arm)   Pulse (!) 49   Temp 98.5 F (36.9 C) (Oral)   Resp 20   Ht 5' 7.5" (1.715 m)   Wt 74.8 kg  SpO2 98%   BMI 25.46 kg/m   Physical Exam Vitals and nursing note reviewed.  Constitutional:      Appearance: Normal appearance.  HENT:     Head: Normocephalic and atraumatic.     Right Ear: External ear normal.     Left Ear: External ear normal.     Nose: Nose normal.     Mouth/Throat:     Mouth: Mucous membranes are moist.     Pharynx: Oropharynx is clear.  Eyes:     Extraocular Movements: Extraocular movements intact.     Conjunctiva/sclera: Conjunctivae normal.     Pupils: Pupils are equal, round, and reactive to light.  Cardiovascular:     Rate and Rhythm: Normal rate and regular rhythm.     Pulses: Normal pulses.     Heart sounds: Normal heart sounds.  Pulmonary:     Effort: Pulmonary effort is normal.     Breath sounds: Normal breath sounds.  Abdominal:     General: Abdomen is flat. Bowel sounds are normal.      Palpations: Abdomen is soft.  Musculoskeletal:        General: Normal range of motion.     Cervical back: Normal range of motion and neck supple.  Skin:    General: Skin is warm.     Capillary Refill: Capillary refill takes less than 2 seconds.     Comments: Tick bite left upper thigh.  Neurological:     General: No focal deficit present.     Mental Status: He is alert and oriented to person, place, and time.  Psychiatric:        Mood and Affect: Mood normal.        Behavior: Behavior normal.        Thought Content: Thought content normal.        Judgment: Judgment normal.     ED Results / Procedures / Treatments   Labs (all labs ordered are listed, but only abnormal results are displayed) Labs Reviewed - No data to display  EKG None  Radiology No results found.  Procedures .Marland KitchenIncision and Drainage  Date/Time: 07/18/2019 7:41 PM Performed by: Isla Pence, MD Authorized by: Isla Pence, MD   Consent:    Consent obtained:  Verbal   Consent given by:  Patient   Risks discussed:  Bleeding   Alternatives discussed:  No treatment Location:    Size:  .1   Location:  Lower extremity   Lower extremity location:  Leg   Leg location:  L upper leg Pre-procedure details:    Skin preparation:  Betadine Anesthesia (see MAR for exact dosages):    Anesthesia method:  Local infiltration   Local anesthetic:  Lidocaine 2% WITH epi Procedure type:    Complexity:  Simple Procedure details:    Incision types:  Single straight   Scalpel blade:  11   Packing materials:  None Post-procedure details:    Patient tolerance of procedure:  Tolerated well, no immediate complications Comments:     No tick pieces visualized.  No pus drained as it was not an abscess.   (including critical care time)  Medications Ordered in ED Medications  polymixin-bacitracin (POLYSPORIN) ointment (has no administration in time range)  lidocaine-EPINEPHrine (XYLOCAINE W/EPI) 2 %-1:200000  (PF) injection 10 mL (10 mLs Infiltration Given by Other 07/18/19 1925)    ED Course  I have reviewed the triage vital signs and the nursing notes.  Pertinent labs & imaging results that were available during  my care of the patient were reviewed by me and considered in my medical decision making (see chart for details).    MDM Rules/Calculators/A&P                      No tick parts identified.  Dressing applied.  Pt stable for d/c. Final Clinical Impression(s) / ED Diagnoses Final diagnoses:  Tick bite, initial encounter    Rx / DC Orders ED Discharge Orders    None       Isla Pence, MD 07/18/19 Leeanne Mannan    Isla Pence, MD 07/29/19 [phone]

## 2019-07-21 ENCOUNTER — Emergency Department (HOSPITAL_BASED_OUTPATIENT_CLINIC_OR_DEPARTMENT_OTHER)
Admission: EM | Admit: 2019-07-21 | Discharge: 2019-07-21 | Disposition: A | Discharge status: Home / Self Care | Payer: Medicare Other | Attending: Emergency Medicine | Admitting: Emergency Medicine

## 2019-07-21 ENCOUNTER — Encounter (HOSPITAL_BASED_OUTPATIENT_CLINIC_OR_DEPARTMENT_OTHER): Payer: Self-pay | Admitting: Emergency Medicine

## 2019-07-21 ENCOUNTER — Emergency Department (HOSPITAL_BASED_OUTPATIENT_CLINIC_OR_DEPARTMENT_OTHER): Payer: Medicare Other

## 2019-07-21 DIAGNOSIS — Z7982 Long term (current) use of aspirin: Secondary | ICD-10-CM | POA: Insufficient documentation

## 2019-07-21 DIAGNOSIS — Z79899 Other long term (current) drug therapy: Secondary | ICD-10-CM | POA: Insufficient documentation

## 2019-07-21 DIAGNOSIS — R002 Palpitations: Secondary | ICD-10-CM

## 2019-07-21 DIAGNOSIS — E782 Mixed hyperlipidemia: Secondary | ICD-10-CM | POA: Diagnosis not present

## 2019-07-21 DIAGNOSIS — R0789 Other chest pain: Secondary | ICD-10-CM | POA: Diagnosis not present

## 2019-07-21 DIAGNOSIS — R42 Dizziness and giddiness: Secondary | ICD-10-CM | POA: Diagnosis not present

## 2019-07-21 DIAGNOSIS — R55 Syncope and collapse: Secondary | ICD-10-CM

## 2019-07-21 LAB — CBC WITH DIFFERENTIAL/PLATELET
Abs Immature Granulocytes: 0.03 10*3/uL (ref 0.00–0.07)
Basophils Absolute: 0 10*3/uL (ref 0.0–0.1)
Basophils Relative: 0 %
Eosinophils Absolute: 0 10*3/uL (ref 0.0–0.5)
Eosinophils Relative: 1 %
HCT: 42.2 % (ref 39.0–52.0)
Hemoglobin: 14.9 g/dL (ref 13.0–17.0)
Immature Granulocytes: 1 %
Lymphocytes Relative: 20 %
Lymphs Abs: 1.2 10*3/uL (ref 0.7–4.0)
MCH: 34.6 pg — ABNORMAL HIGH (ref 26.0–34.0)
MCHC: 35.3 g/dL (ref 30.0–36.0)
MCV: 97.9 fL (ref 80.0–100.0)
Monocytes Absolute: 0.5 10*3/uL (ref 0.1–1.0)
Monocytes Relative: 8 %
Neutro Abs: 4.3 10*3/uL (ref 1.7–7.7)
Neutrophils Relative %: 70 %
Platelets: 152 10*3/uL (ref 150–400)
RBC: 4.31 MIL/uL (ref 4.22–5.81)
RDW: 12.1 % (ref 11.5–15.5)
WBC: 6.1 10*3/uL (ref 4.0–10.5)
nRBC: 0 % (ref 0.0–0.2)

## 2019-07-21 LAB — COMPREHENSIVE METABOLIC PANEL
ALT: 15 U/L (ref 0–44)
AST: 19 U/L (ref 15–41)
Albumin: 4.4 g/dL (ref 3.5–5.0)
Alkaline Phosphatase: 50 U/L (ref 38–126)
Anion gap: 11 (ref 5–15)
BUN: 26 mg/dL — ABNORMAL HIGH (ref 8–23)
CO2: 24 mmol/L (ref 22–32)
Calcium: 9.1 mg/dL (ref 8.9–10.3)
Chloride: 107 mmol/L (ref 98–111)
Creatinine, Ser: 1.13 mg/dL (ref 0.61–1.24)
GFR calc Af Amer: >60 mL/min (ref >60)
GFR calc non Af Amer: >60 mL/min (ref >60)
Glucose, Bld: 104 mg/dL — ABNORMAL HIGH (ref 70–99)
Potassium: 3.8 mmol/L (ref 3.5–5.1)
Sodium: 142 mmol/L (ref 135–145)
Total Bilirubin: 1 mg/dL (ref 0.3–1.2)
Total Protein: 6.7 g/dL (ref 6.5–8.1)

## 2019-07-21 LAB — TROPONIN I (HIGH SENSITIVITY)
Troponin I (High Sensitivity): 4 ng/L (ref <18)
Troponin I (High Sensitivity): 5 ng/L (ref <18)

## 2019-07-21 LAB — TSH: TSH: 0.289 u[IU]/mL — ABNORMAL LOW (ref 0.350–4.500)

## 2019-07-21 LAB — MAGNESIUM: Magnesium: 1.8 mg/dL (ref 1.7–2.4)

## 2019-07-21 MED ORDER — SODIUM CHLORIDE 0.9 % IV BOLUS
1000.0000 mL | Freq: Once | INTRAVENOUS | Status: AC
  Administered 2019-07-21: 1000 mL via INTRAVENOUS

## 2019-07-21 NOTE — ED Triage Notes (Signed)
Arrived via EMS, from home with dizziness that occurred while  laying down and has now resolved upon there arrival. No other c/o's. BS 112, BP 130/70, HR 56, R 16, O2 Sat 97%RA, EKG SB. Has a appointment with Cards next month due to having intermittent palpations for the last 2 months

## 2019-07-21 NOTE — Discharge Instructions (Signed)
Your work-up today was overall reassuring and the cardiology team we spoke with [facility] cardiology feel you are safe for discharge home tonight given the reassuring cardiac enzymes and your further reassuring work-up and telemetry monitoring.  You do have the bradycardia (slow heart rate) which we monitored for several hours in the emergency department and occasionally had rates that improved into the normal range.  I suspect your palpitations are when you became slightly tachycardic or faster causing you to have the symptoms.  Given your overall reassuring work-up and improvement with fluids, we feel you are safe for discharge home.  Please follow-up with the cardiology team and if any symptoms change or worsen, please return to the nearest emergency department.

## 2019-07-21 NOTE — ED Provider Notes (Signed)
[name] EMERGENCY DEPARTMENT Provider Note   CSN: [id number] Arrival date & time: 07/21/19  1701     History Chief Complaint  Patient presents with  . Dizziness    Scott Hinton is a 69 y.o. male.  The history is provided by the patient and medical records.  Palpitations Palpitations quality:  Fast Onset quality:  Sudden Duration:  1 minute Timing:  Intermittent Progression:  Waxing and waning Chronicity:  New Relieved by:  Nothing Worsened by:  Nothing Ineffective treatments:  None tried Associated symptoms: chest pain, malaise/fatigue and near-syncope   Associated symptoms: no back pain, no chest pressure, no cough, no diaphoresis, no dizziness, no leg pain, no lower extremity edema, no nausea, no numbness, no shortness of breath, no syncope, no vomiting and no weakness        Past Medical History:  Diagnosis Date  . Hypercholesterolemia   . Sleep apnea with use of continuous positive airway pressure (CPAP) 2010  . Thyroid disease     Patient Active Problem List   Diagnosis Date Noted  . Primary hypothyroidism 03/26/2018  . Mixed hyperlipidemia 03/26/2018  . Gastroesophageal reflux disease without esophagitis 03/26/2018  . OSA on CPAP 03/26/2018    Past Surgical History:  Procedure Laterality Date  . NASAL POLYP SURGERY  1984       Family History  Problem Relation Age of Onset  . Cancer Mother   . Heart disease Father   . Emphysema Father   . Coronary artery disease Father   . Stroke Sister   . Pneumonia Brother   . Stroke Brother   . Heart disease Sister   . Epilepsy Brother     Social History   Tobacco Use  . Smoking status: Never Smoker  . Smokeless tobacco: Never Used  Substance Use Topics  . Alcohol use: No  . Drug use: No    Home Medications Prior to Admission medications   Medication Sig Start Date End Date Taking? Authorizing Provider  aspirin 81 MG chewable tablet Chew 81 mg by mouth daily.    [provider]  atorvastatin (LIPITOR) 10 MG tablet TAKE 1 TABLET BY MOUTH EVERY DAY 03/02/19   Glendale Chard, MD  cholecalciferol (VITAMIN D) 1000 units tablet Take 2,000 Units by mouth daily.    [provider]  fenofibrate (TRICOR) 48 MG tablet Take 1 tablet (48 mg total) by mouth daily. 01/05/19   Glendale Chard, MD  fexofenadine (ALLEGRA) 180 MG tablet Take 180 mg by mouth daily.    [provider]  levothyroxine (SYNTHROID) 100 MCG tablet TAKE 1 TABLET BY MOUTH DAILY 06/04/19   Glendale Chard, MD  levothyroxine (SYNTHROID) 88 MCG tablet TAKE 1 TABLET BY MOUTH EVERY DAY ALONG WITH 100 MCG 06/04/19   Glendale Chard, MD  meclizine (ANTIVERT) 25 MG tablet Take 1 tablet (25 mg total) by mouth 3 (three) times daily as needed for dizziness (mild dizziness). 02/09/19   Glendale Chard, MD  Multiple Vitamin (MULTIVITAMIN) tablet Take 1 tablet by mouth daily.    [provider]  omeprazole (PRILOSEC) 40 MG capsule TAKE 1 CAPSULE BY MOUTH EVERY DAY 02/11/19   Glendale Chard, MD  prednisoLONE acetate (PRED FORTE) 1 % ophthalmic suspension SMARTSIG:4 Drop(s) In Ear(s) Twice Daily PRN 03/02/19   [provider]    Allergies    Patient has no known allergies.  Review of Systems   Review of Systems  Constitutional: Positive for malaise/fatigue. Negative for chills, diaphoresis, fatigue and fever.  HENT: Negative for congestion.   Eyes: Negative for visual disturbance.  Respiratory: Negative for cough, chest tightness, shortness of breath, wheezing and stridor.   Cardiovascular: Positive for chest pain, palpitations and near-syncope. Negative for leg swelling and syncope.  Gastrointestinal: Negative for abdominal pain, constipation, diarrhea, nausea and vomiting.  Genitourinary: Negative for dysuria, flank pain and frequency.  Musculoskeletal: Negative for back pain, neck pain and neck stiffness.  Skin: Negative for rash and wound.  Neurological: Negative for dizziness,  syncope, weakness, light-headedness, numbness and headaches.  Psychiatric/Behavioral: Negative for agitation.  All other systems reviewed and are negative.   Physical Exam Updated Vital Signs BP 123/74 (BP Location: Right Arm)   Pulse (!) 57   Temp 98.7 F (37.1 C) (Oral)   Resp 17   SpO2 97%   Physical Exam Vitals and nursing note reviewed.  Constitutional:      General: He is not in acute distress.    Appearance: He is not ill-appearing, toxic-appearing or diaphoretic.  HENT:     Head: Normocephalic.     Nose: Nose normal. No congestion or rhinorrhea.     Mouth/Throat:     Mouth: Mucous membranes are moist.  Eyes:     Conjunctiva/sclera: Conjunctivae normal.     Pupils: Pupils are equal, round, and reactive to light.  Cardiovascular:     Rate and Rhythm: Regular rhythm. Bradycardia present.     Pulses: Normal pulses.     Heart sounds: No murmur.  Pulmonary:     Effort: Pulmonary effort is normal. No respiratory distress.     Breath sounds: No stridor. No wheezing, rhonchi or rales.  Chest:     Chest wall: No tenderness.  Abdominal:     General: Abdomen is flat. There is no distension.     Tenderness: There is no abdominal tenderness. There is no right CVA tenderness, left CVA tenderness, guarding or rebound.  Musculoskeletal:        General: No swelling, tenderness or deformity.     Cervical back: No rigidity or tenderness.     Right lower leg: No edema.     Left lower leg: No edema.  Skin:    Capillary Refill: Capillary refill takes less than 2 seconds.     Findings: No erythema or rash.  Neurological:     General: No focal deficit present.     Mental Status: He is alert and oriented to person, place, and time.     Sensory: No sensory deficit.     Motor: No weakness.  Psychiatric:        Mood and Affect: Mood normal.     ED Results / Procedures / Treatments   Labs (all labs ordered are listed, but only abnormal results are displayed) Labs Reviewed  CBC  WITH DIFFERENTIAL/PLATELET - Abnormal; Notable for the following components:      Result Value   MCH 34.6 (*)    All other components within normal limits  COMPREHENSIVE METABOLIC PANEL - Abnormal; Notable for the following components:   Glucose, Bld 104 (*)    BUN 26 (*)    All other components within normal limits  TSH - Abnormal; Notable for the following components:   TSH 0.289 (*)    All other components within normal limits  MAGNESIUM  TROPONIN I (HIGH SENSITIVITY)  TROPONIN I (HIGH SENSITIVITY)    EKG EKG Interpretation  Date/Time:  Tuesday Jul 21 2019 17:06:30 EDT Ventricular Rate:  54 PR Interval:    QRS  Duration: 100 QT Interval:  419 QTC Calculation: 397 R Axis:   -18 Text Interpretation: Sinus rhythm Borderline left axis deviation When compared to prior, no significant changes seen. No STEMI Confirmed by Antony Blackbird [phone]) on 07/21/2019 5:10:57 PM   Radiology DG Chest 2 View  Result Date: 07/21/2019 CLINICAL DATA:  Near syncope EXAM: CHEST - 2 VIEW COMPARISON:  06/11/2019 FINDINGS: The heart size and mediastinal contours are within normal limits. Both lungs are clear. The visualized skeletal structures are unremarkable. IMPRESSION: No active cardiopulmonary disease. Electronically Signed   By: Donavan Foil M.D.   On: 07/21/2019 18:44    Procedures Procedures (including critical care time)  Medications Ordered in ED Medications  sodium chloride 0.9 % bolus 1,000 mL (0 mLs Intravenous Stopped 07/21/19 2025)    ED Course  I have reviewed the triage vital signs and the nursing notes.  Pertinent labs & imaging results that were available during my care of the patient were reviewed by me and considered in my medical decision making (see chart for details).    MDM Rules/Calculators/A&P                      Yeray Boldon is a 69 y.o. male with a past medical history significant for hyperlipidemia, hypothyroidism, GERD, and currently being worked up by  cardiology for bradycardia who presents with 2 episodes of fast palpitations, chest pain, and near syncope.  Patient reports that today, he was walking outside and felt like he was going to pass out when his heart started racing, feeling palpitations, having chest discomfort.  He reports it was an aching and burning/pressure pain in his left chest that did not radiate.  He denies shortness of breath with it.  He reports it lasted around 1 minute and when he checked his pulse oximetry, his heart rate was in the 90s.  He reports for the last few months he has had heart rates primarily in the 40s and 41s which has been bradycardic and is being worked up by cardiology for this currently.  He says that it improved and he was able to walk around but then it happened again and he laid down on the ground because he did not want to pass out and hit his head.  He reports he has not had any further symptoms other than feeling slightly tired.  He denies nausea, vomiting, constipation, diarrhea, headache, neck pain, neck stiffness.  Denies any abdominal pain.  Denies any new edema.  He does report he had a tick on him several days ago and it was removed by the ED provider without any evidence of retained tick.  No rashes otherwise reported.  No recent medication changes.  Has been taking his thyroid medication.  On exam, lungs are clear and chest was nontender.  Abdomen is nontender.  No focal neurologic deficits.  No murmur appreciated.  Good pulses in extremities.  Patient's heart rate is in the 50s which she reports is normal for him recently.  A red area seen on his left thigh from the recent tick removal but no ring or redness.  No evidence of RMSF.  Clinically I suspect patient was having a fast heart rate and symptomatic palpitations causing the lightheadedness.  We will check electrolytes, give him some fluids, and get chest x-ray to the chest discomfort.  Anticipate reassessment after work-up.  Anticipate speaking  with cardiology to determine disposition.  Cardiology reviewed the patient's chart and EKG.  Troponin  negative x2.  Other labs reassuring.  Chest x-ray showed no acute cardiopulmonary disease.  TSH still in process and will not return tonight.  PCP can follow-up on this with cardiology.  On reassessment, patient talking in the mammogram talk with them and get a radiograph otherwise work-up is reassuring and his heart rate has been bradycardic consistent with priors in the past.  I spoke with cardiology whom he is going to see in a week and a half.  They feel he is safe for discharge home given the reassuring troponin and reassuring orthostatics on evaluation.  He was asymptomatic when he stood up.  He felt better after fluids and we feel he safe for discharge home.  He was able to eat and drink without difficulty.  He will follow up with cardiology and will call them to try to even get a sooner appointment.  He understands return precautions and was discharged in good condition.   Final Clinical Impression(s) / ED Diagnoses Final diagnoses:  Near syncope  Palpitations  Lightheadedness    Rx / DC Orders ED Discharge Orders    None      Clinical Impression: 1. Near syncope   2. Palpitations   3. Lightheadedness     Disposition: Discharge  Condition: Good  I have discussed the results, Dx and Tx plan with the pt(& family if present). He/she/they expressed understanding and agree(s) with the plan. Discharge instructions discussed at great length. Strict return precautions discussed and pt &/or family have verbalized understanding of the instructions. No further questions at time of discharge.    New Prescriptions   No medications on file    Follow Up: Nigel Mormon, MD [place] [address] [place] [address] [id number] [place] [address] [place]  [place], [place], [place] [address] [place] [address] [phone]        Krystall Kruckenberg, Gwenyth Allegra, MD 07/22/19 [phone]

## 2019-07-22 ENCOUNTER — Encounter: Payer: Self-pay | Admitting: Internal Medicine

## 2019-07-23 ENCOUNTER — Encounter: Payer: Self-pay | Admitting: Internal Medicine

## 2019-07-24 ENCOUNTER — Other Ambulatory Visit: Payer: Self-pay

## 2019-07-25 ENCOUNTER — Encounter: Payer: Self-pay | Admitting: Internal Medicine

## 2019-07-26 ENCOUNTER — Other Ambulatory Visit: Payer: Self-pay | Admitting: Internal Medicine

## 2019-07-26 MED ORDER — LEVOTHYROXINE SODIUM 175 MCG PO TABS
175.0000 ug | ORAL_TABLET | Freq: Every day | ORAL | 0 refills | Status: DC
Start: 2019-07-26 — End: 2019-09-16

## 2019-07-31 ENCOUNTER — Encounter: Payer: Self-pay | Admitting: Internal Medicine

## 2019-07-31 NOTE — Progress Notes (Signed)
Patient referred by Glendale Chard, MD for palpitations  Subjective:   Scott Hinton, male    DOB: Oct 13, 1950, 69 y.o.   MRN: [id number]   Chief Complaint  Patient presents with  . Palpitations  . New Patient (Initial Visit)    Referred by Dr. Glendale Chard    HPI  69 y.o. Caucasian male with hypothyroidism, referred for evaluation of presyncope.  Patient is retired Scientist, research (life sciences), stays very active walking up to 5 miles every day.  He has had longstanding hypothyroidism, managed by PCP.  Few weeks ago, patient had episode when he steps outside his house into the garage to go for his regular walk.  He remembers feeling hot, followed by sudden drop in his heart rate, associated with diaphoresis and lightheadedness.  He did not lose consciousness.  He was able to recover from this episode and stand up on his feet.  He was evaluated by EMS and then taken to the ED.  Work-up was essentially unremarkable.  Since then, has not had any recurrent episodes of presyncope.  During his regular walks, he denies any chest pain, shortness of breath or any other symptoms.  He was noted to have his TSH lower than normal.  He has levothyroxine dose was then reduced.  Past Medical History:  Diagnosis Date  . Hypercholesterolemia   . Sleep apnea with use of continuous positive airway pressure (CPAP) 2010  . Thyroid disease      Past Surgical History:  Procedure Laterality Date  . NASAL POLYP SURGERY  1984     Social History   Tobacco Use  Smoking Status Never Smoker  Smokeless Tobacco Never Used    Social History   Substance and Sexual Activity  Alcohol Use No     Family History  Problem Relation Age of Onset  . Cancer Mother   . Heart disease Father   . Emphysema Father   . Coronary artery disease Father   . Stroke Sister   . Pneumonia Brother   . Stroke Brother   . Heart disease Sister   . Epilepsy Brother      Current Outpatient Medications on File Prior to Visit    Medication Sig Dispense Refill  . aspirin 81 MG chewable tablet Chew 81 mg by mouth daily.    Marland Kitchen atorvastatin (LIPITOR) 10 MG tablet TAKE 1 TABLET BY MOUTH EVERY DAY 90 tablet 1  . cholecalciferol (VITAMIN D) 1000 units tablet Take 2,000 Units by mouth daily.    . fenofibrate (TRICOR) 48 MG tablet Take 1 tablet (48 mg total) by mouth daily. 90 tablet 2  . fexofenadine (ALLEGRA) 180 MG tablet Take 180 mg by mouth daily.    Marland Kitchen levothyroxine (SYNTHROID) 175 MCG tablet Take 1 tablet (175 mcg total) by mouth daily before breakfast. 90 tablet 0  . Multiple Vitamin (MULTIVITAMIN) tablet Take 1 tablet by mouth daily.    Marland Kitchen omeprazole (PRILOSEC) 40 MG capsule TAKE 1 CAPSULE BY MOUTH EVERY DAY 90 capsule 1  . prednisoLONE acetate (PRED FORTE) 1 % ophthalmic suspension SMARTSIG:4 Drop(s) In Ear(s) Twice Daily PRN    . meclizine (ANTIVERT) 25 MG tablet Take 1 tablet (25 mg total) by mouth 3 (three) times daily as needed for dizziness (mild dizziness). (Patient not taking: Reported on 08/03/2019) 30 tablet 1   No current facility-administered medications on file prior to visit.    Cardiovascular and other pertinent studies:  EKG 08/03/2019: Sinus bradycardia 50 bpm Otherwise normal EKG  Recent labs:  07/21/2019: Glucose 104, BUN/Cr 26/1.13. EGFR 60. Na/K 142/3.8. Rest of the CMP normal H/H 14.9/42.2. MCV 97.2. Platelets 152 TSH 0.289 low  04/09/2019: Chol 133, TG 42, HDL 45, LDL 78  2018: HbA1C 5.3%   Review of Systems  Cardiovascular: Negative for chest pain, dyspnea on exertion, leg swelling, palpitations and syncope.  Neurological: Positive for light-headedness.        Vitals:   08/03/19 1254  BP: 123/64  Pulse: (!) 49  SpO2: 98%     Body mass index is 25.46 kg/m. Filed Weights   08/03/19 1254  Weight: 165 lb (74.8 kg)     Objective:   Physical Exam  Constitutional: No distress.  Neck: No JVD present.  Cardiovascular: Regular rhythm, normal heart sounds and intact distal  pulses. Bradycardia present.  No murmur heard. Pulmonary/Chest: Effort normal and breath sounds normal. He has no wheezes. He has no rales.  Musculoskeletal:        General: No edema.  Nursing note and vitals reviewed.      Assessment & Recommendations:   69 y.o. Caucasian male with hypothyroidism, referred for evaluation of presyncope.  Presyncope: Most likely vasovagal episode.  Low suspicion for arrhythmia as etiology.  His resting heart rate in 40s and 50s, is likely function of his condition heart, underlying hypothyroidism, as well as OSA.  Will check echocardiogram and 4-week event monitor.  If no significant abnormalities found, no further cardiac work-up necessary.  Encouraged the patient to hydrate himself well, especially during summer months.  We also discussed counterpressure maneuvers.  I will see him on as-needed basis, unless significant abnormalities found on above work.     Thank you for referring the patient to [place]. Please feel free to contact with any questions.  Nigel Mormon, MD [facility] Pager: [phone] Office: [phone]

## 2019-08-03 ENCOUNTER — Other Ambulatory Visit: Payer: Self-pay

## 2019-08-03 ENCOUNTER — Encounter: Payer: Self-pay | Admitting: Cardiology

## 2019-08-03 ENCOUNTER — Ambulatory Visit: Payer: Medicare Other | Admitting: Cardiology

## 2019-08-03 VITALS — BP 123/64 | HR 49 | Resp 16 | Ht 67.5 in | Wt 165.0 lb

## 2019-08-03 DIAGNOSIS — R55 Syncope and collapse: Secondary | ICD-10-CM | POA: Diagnosis not present

## 2019-08-03 DIAGNOSIS — R001 Bradycardia, unspecified: Secondary | ICD-10-CM | POA: Diagnosis not present

## 2019-08-03 DIAGNOSIS — R002 Palpitations: Secondary | ICD-10-CM | POA: Insufficient documentation

## 2019-08-03 DIAGNOSIS — R42 Dizziness and giddiness: Secondary | ICD-10-CM | POA: Diagnosis not present

## 2019-08-05 ENCOUNTER — Telehealth: Payer: Self-pay

## 2019-08-05 NOTE — Telephone Encounter (Signed)
I called the pt and provided him with the contact information for [facility] on [place], [place] [address]  [phone].

## 2019-08-06 ENCOUNTER — Other Ambulatory Visit: Payer: Self-pay | Admitting: Internal Medicine

## 2019-08-07 ENCOUNTER — Other Ambulatory Visit: Payer: Self-pay

## 2019-08-07 ENCOUNTER — Ambulatory Visit: Payer: Medicare Other

## 2019-08-07 DIAGNOSIS — R55 Syncope and collapse: Secondary | ICD-10-CM

## 2019-08-07 DIAGNOSIS — R42 Dizziness and giddiness: Secondary | ICD-10-CM | POA: Diagnosis not present

## 2019-08-10 NOTE — Progress Notes (Signed)
Is he wearing an event monitor?

## 2019-08-13 ENCOUNTER — Encounter: Payer: Self-pay | Admitting: Internal Medicine

## 2019-09-07 ENCOUNTER — Encounter: Payer: Self-pay | Admitting: Internal Medicine

## 2019-09-07 ENCOUNTER — Other Ambulatory Visit: Payer: Self-pay | Admitting: Internal Medicine

## 2019-09-07 NOTE — Telephone Encounter (Signed)
Your low heart rate is likely the result of your overall good athletic conditioning. Low thyroid function could also contribute to this. As long as you are not having any recurrent lightheadedness events, I would not be concerned with low heart rate.  Regards, Dr. Virgina Jock

## 2019-09-07 NOTE — Telephone Encounter (Signed)
From patient.

## 2019-09-14 ENCOUNTER — Other Ambulatory Visit: Payer: Medicare Other

## 2019-09-14 ENCOUNTER — Other Ambulatory Visit: Payer: Self-pay

## 2019-09-14 DIAGNOSIS — E039 Hypothyroidism, unspecified: Secondary | ICD-10-CM | POA: Diagnosis not present

## 2019-09-15 ENCOUNTER — Encounter: Payer: Self-pay | Admitting: Internal Medicine

## 2019-09-15 LAB — TSH: TSH: 0.13 u[IU]/mL — ABNORMAL LOW (ref 0.450–4.500)

## 2019-09-15 LAB — T4, FREE: Free T4: 2.22 ng/dL — ABNORMAL HIGH (ref 0.82–1.77)

## 2019-09-16 ENCOUNTER — Other Ambulatory Visit: Payer: Self-pay

## 2019-09-16 MED ORDER — LEVOTHYROXINE SODIUM 150 MCG PO TABS
150.0000 ug | ORAL_TABLET | Freq: Every day | ORAL | 1 refills | Status: DC
Start: 2019-09-16 — End: 2019-10-14

## 2019-09-27 ENCOUNTER — Encounter: Payer: Self-pay | Admitting: Internal Medicine

## 2019-10-02 ENCOUNTER — Other Ambulatory Visit: Payer: Self-pay | Admitting: Internal Medicine

## 2019-10-13 ENCOUNTER — Other Ambulatory Visit: Payer: Self-pay

## 2019-10-13 ENCOUNTER — Ambulatory Visit (INDEPENDENT_AMBULATORY_CARE_PROVIDER_SITE_OTHER): Payer: Medicare Other | Admitting: Internal Medicine

## 2019-10-13 ENCOUNTER — Other Ambulatory Visit: Payer: Self-pay | Admitting: Internal Medicine

## 2019-10-13 ENCOUNTER — Encounter: Payer: Self-pay | Admitting: Internal Medicine

## 2019-10-13 VITALS — BP 124/76 | HR 42 | Temp 97.4°F | Ht 67.5 in | Wt 166.2 lb

## 2019-10-13 DIAGNOSIS — Z79899 Other long term (current) drug therapy: Secondary | ICD-10-CM

## 2019-10-13 DIAGNOSIS — E039 Hypothyroidism, unspecified: Secondary | ICD-10-CM

## 2019-10-13 DIAGNOSIS — E782 Mixed hyperlipidemia: Secondary | ICD-10-CM | POA: Diagnosis not present

## 2019-10-13 DIAGNOSIS — K219 Gastro-esophageal reflux disease without esophagitis: Secondary | ICD-10-CM

## 2019-10-13 DIAGNOSIS — E663 Overweight: Secondary | ICD-10-CM | POA: Diagnosis not present

## 2019-10-13 MED ORDER — PANTOPRAZOLE SODIUM 40 MG PO TBEC
40.0000 mg | DELAYED_RELEASE_TABLET | Freq: Every day | ORAL | 1 refills | Status: DC
Start: 2019-10-13 — End: 2019-10-13

## 2019-10-13 NOTE — Patient Instructions (Signed)
Preventing High Cholesterol Cholesterol is a white, waxy substance similar to fat that the human body needs to help build cells. The liver makes all the cholesterol that a person's body needs. Having high cholesterol (hypercholesterolemia) increases a person's risk for heart disease and stroke. Extra (excess) cholesterol comes from the food the person eats. High cholesterol can often be prevented with diet and lifestyle changes. If you already have high cholesterol, you can control it with diet and lifestyle changes and with medicine. How can high cholesterol affect me? If you have high cholesterol, deposits (plaques) may build up on the walls of your arteries. The arteries are the blood vessels that carry blood away from your heart. Plaques make the arteries narrower and stiffer. This can limit or block blood flow and cause blood clots to form. Blood clots:  Are tiny balls of cells that form in your blood.  Can move to the heart or brain, causing a heart attack or stroke. Plaques in arteries greatly increase your risk for heart attack and stroke.Making diet and lifestyle changes can reduce your risk for these conditions that may threaten your life. What can increase my risk? This condition is more likely to develop in people who:  Eat foods that are high in saturated fat or cholesterol. Saturated fat is mostly found in: ? Foods that contain animal fat, such as red meat and some dairy products. ? Certain fatty foods made from plants, such as tropical oils.  Are overweight.  Are not getting enough exercise.  Have a family history of high cholesterol. What actions can I take to prevent this? Nutrition   Eat less saturated fat.  Avoid trans fats (partially hydrogenated oils). These are often found in margarine and in some baked goods, fried foods, and snacks bought in packages.  Avoid precooked or cured meat, such as sausages or meat loaves.  Avoid foods and drinks that have added  sugars.  Eat more fruits, vegetables, and whole grains.  Choose healthy sources of protein, such as fish, poultry, lean cuts of red meat, beans, peas, lentils, and nuts.  Choose healthy sources of fat, such as: ? Nuts. ? Vegetable oils, especially olive oil. ? Fish that have healthy fats (omega-3 fatty acids), such as mackerel or salmon. The items listed above may not be a complete list of recommended foods and beverages. Contact a dietitian for more information. Lifestyle  Lose weight if you are overweight. Losing 5-10 lb (2.3-4.5 kg) can help prevent or control high cholesterol. It can also lower your risk for diabetes and high blood pressure. Ask your health care provider to help you with a diet and exercise plan to lose weight safely.  Do not use any products that contain nicotine or tobacco, such as cigarettes, e-cigarettes, and chewing tobacco. If you need help quitting, ask your health care provider.  Limit your alcohol intake. ? Do not drink alcohol if:  Your health care provider tells you not to drink.  You are pregnant, may be pregnant, or are planning to become pregnant. ? If you drink alcohol:  Limit how much you use to:  0-1 drink a day for women.  0-2 drinks a day for men.  Be aware of how much alcohol is in your drink. In the [place] one drink equals one 12 oz bottle of beer (355 mL), one 5 oz glass of wine (148 mL), or one 1 oz glass of hard liquor (44 mL). Activity   Get enough exercise. Each week, do at   least 150 minutes of exercise that takes a medium level of effort (moderate-intensity exercise). ? This is exercise that:  Makes your heart beat faster and makes you breathe harder than usual.  Allows you to still be able to talk. ? You could exercise in short sessions several times a day or longer sessions a few times a week. For example, on 5 days each week, you could walk fast or ride your bike 3 times a day for 10 minutes each time.  Do exercises as told  by your health care provider. Medicines  In addition to diet and lifestyle changes, your health care provider may recommend medicines to help lower cholesterol. This may be a medicine to lower the amount of cholesterol your liver makes. You may need medicine if: ? Diet and lifestyle changes do not lower your cholesterol enough. ? You have high cholesterol and other risk factors for heart disease or stroke.  Take over-the-counter and prescription medicines only as told by your health care provider. General information  Manage your risk factors for high cholesterol. Talk with your health care provider about all your risk factors and how to lower your risk.  Manage other conditions that you have, such as diabetes or high blood pressure (hypertension).  Have blood tests to check your cholesterol levels at regular points in time as told by your health care provider.  Keep all follow-up visits as told by your health care provider. This is important. Where to find more information  American Heart Association: www.heart.org  National Heart, Lung, and Blood Institute: www.nhlbi.nih.gov Summary  High cholesterol increases your risk for heart disease and stroke. By keeping your cholesterol level low, you can reduce your risk for these conditions.  High cholesterol can often be prevented with diet and lifestyle changes.  Work with your health care provider to manage your risk factors, and have your blood tested regularly. This information is not intended to replace advice given to you by your health care provider. Make sure you discuss any questions you have with your health care provider. Document Revised: 06/06/2018 Document Reviewed: 10/22/2015 Elsevier Patient Education  2020 Elsevier Inc.  

## 2019-10-13 NOTE — Progress Notes (Signed)
I,Katawbba Wiggins,acting as a Education administrator for [name], MD.,have documented all relevant documentation on the behalf of [name], MD,as directed by  [name], MD while in the presence of [name], MD.  This visit occurred during the SARS-CoV-2 public health emergency.  Safety protocols were in place, including screening questions prior to the visit, additional usage of staff PPE, and extensive cleaning of exam room while observing appropriate contact time as indicated for disinfecting solutions.  Subjective:     Patient ID: Scott Hinton , male    DOB: 26-Jan-1951 , 69 y.o.   MRN: [id number]   Chief Complaint  Patient presents with  . Hyperlipidemia  . Hypothyroidism    HPI  He is here today for a cholesterol and thyroid follow-up. He reports compliance with meds. He reports having less palpitations since changing the dose of the thyroid meds.  Hyperlipidemia This is a chronic problem. The current episode started more than 1 year ago. The problem is controlled. Exacerbating diseases include hypothyroidism. Current antihyperlipidemic treatment includes statins. The current treatment provides moderate improvement of lipids. Risk factors for coronary artery disease include male sex.  Thyroid Problem Presents for follow-up visit. Symptoms include anxiety. The symptoms have been improving. His past medical history is significant for hyperlipidemia.     Past Medical History:  Diagnosis Date  . Hypercholesterolemia   . Sleep apnea with use of continuous positive airway pressure (CPAP) 2010  . Thyroid disease      Family History  Problem Relation Age of Onset  . Liver cancer Mother   . Atrial fibrillation Mother   . Heart disease Father   . Emphysema Father   . Coronary artery disease Father   . Stroke Sister        2  . Atrial fibrillation Sister   . Pneumonia Brother   . Stroke Brother   . Healthy Brother   . Stroke Brother   . Heart disease Sister   .  Epilepsy Brother   . Pneumonia Brother      Current Outpatient Medications:  .  aspirin 81 MG chewable tablet, Chew 81 mg by mouth daily., Disp: , Rfl:  .  atorvastatin (LIPITOR) 10 MG tablet, TAKE 1 TABLET BY MOUTH EVERY DAY, Disp: 90 tablet, Rfl: 1 .  cholecalciferol (VITAMIN D) 1000 units tablet, Take 2,000 Units by mouth daily., Disp: , Rfl:  .  fenofibrate (TRICOR) 48 MG tablet, TAKE 1 TABLET(48 MG) BY MOUTH DAILY, Disp: 90 tablet, Rfl: 2 .  fexofenadine (ALLEGRA) 180 MG tablet, Take 180 mg by mouth daily., Disp: , Rfl:  .  levothyroxine (SYNTHROID) 150 MCG tablet, Take 1 tablet (150 mcg total) by mouth daily before breakfast., Disp: 90 tablet, Rfl: 1 .  meclizine (ANTIVERT) 25 MG tablet, Take 1 tablet (25 mg total) by mouth 3 (three) times daily as needed for dizziness (mild dizziness)., Disp: 30 tablet, Rfl: 1 .  Multiple Vitamin (MULTIVITAMIN) tablet, Take 1 tablet by mouth daily. ISOTONIX POWDER MIX : Multivitamin, opc-3, Calcium plus, Activated B-Complex, Disp: , Rfl:  .  omeprazole (PRILOSEC) 40 MG capsule, TAKE 1 CAPSULE BY MOUTH EVERY DAY, Disp: 90 capsule, Rfl: 1 .  prednisoLONE acetate (PRED FORTE) 1 % ophthalmic suspension, SMARTSIG:4 Drop(s) In Ear(s) Twice Daily PRN, Disp: , Rfl:  .  pantoprazole (PROTONIX) 40 MG tablet, TAKE 1 TABLET(40 MG) BY MOUTH DAILY, Disp: 90 tablet, Rfl: 1   No Known Allergies   Review of Systems  Constitutional: Negative.  Respiratory: Negative.   Cardiovascular: Negative.   Gastrointestinal: Negative.        C/o frequent indigestion despite continued use of prilosec.  Psychiatric/Behavioral: The patient is nervous/anxious.   All other systems reviewed and are negative.    Today's Vitals   10/13/19 1005  BP: 124/76  Pulse: (!) 42  Temp: (!) 97.4 F (36.3 C)  TempSrc: Oral  Weight: 166 lb 3.2 oz (75.4 kg)  Height: 5' 7.5" (1.715 m)  PainSc: 3   PainLoc: Generalized   Body mass index is 25.65 kg/m.  Wt Readings from Last 3  Encounters:  10/13/19 166 lb 3.2 oz (75.4 kg)  08/03/19 165 lb (74.8 kg)  07/18/19 165 lb (74.8 kg)   Objective:  Physical Exam Vitals and nursing note reviewed.  Constitutional:      Appearance: Normal appearance. He is obese.  HENT:     Head: Normocephalic and atraumatic.  Cardiovascular:     Rate and Rhythm: Normal rate and regular rhythm.     Heart sounds: Normal heart sounds.  Pulmonary:     Breath sounds: Normal breath sounds.  Skin:    General: Skin is warm.  Neurological:     General: No focal deficit present.     Mental Status: He is alert and oriented to person, place, and time.         Assessment And Plan:     1. Mixed hyperlipidemia Comments: Chronic, I will check a fasting lipid panel and ALT today. Encouraged to avoid fried foods and to exercise regularly.  He will RTO in six months for re-evaluation.  - Lipid panel  2. Primary hypothyroidism Comments: I will check thyroid panel and adjust meds as needed. If TSH too low, he is reminded that this could be contributing to his anxiety symptoms. - TSH - T4, Free  3. Gastroesophageal reflux disease without esophagitis Comments: Chronic. I will d/c omeprazole and send rx pantoprazole to the pharmacy. He is encouraged to let me know how this is working for him in a week or two.    4. Overweight (BMI 25.0-29.9)  BMI 25. His weight is stable for his demographic. He is encouraged to exercise at least 150 minutes per week.   5. Drug therapy - ALT - Magnesium - Vitamin B12 BMI 25. His weight is stable for his demographic. The patient was encouraged to exercise at least 150 minutes per week to decrease his risk of a cardiac event.    Patient was given opportunity to ask questions. Patient verbalized understanding of the plan and was able to repeat key elements of the plan. All questions were answered to their satisfaction.  [name], MD   I, [name], MD, have reviewed all documentation for this  visit. The documentation on 10/13/19 for the exam, diagnosis, procedures, and orders are all accurate and complete.  THE PATIENT IS ENCOURAGED TO PRACTICE SOCIAL DISTANCING DUE TO THE COVID-19 PANDEMIC.

## 2019-10-14 ENCOUNTER — Other Ambulatory Visit: Payer: Self-pay

## 2019-10-14 LAB — LIPID PANEL
Chol/HDL Ratio: 2.6 ratio (ref 0.0–5.0)
Cholesterol, Total: 113 mg/dL (ref 100–199)
HDL: 44 mg/dL (ref >39)
LDL Chol Calc (NIH): 58 mg/dL (ref 0–99)
Triglycerides: 45 mg/dL (ref 0–149)
VLDL Cholesterol Cal: 11 mg/dL (ref 5–40)

## 2019-10-14 LAB — TSH: TSH: 0.386 u[IU]/mL — ABNORMAL LOW (ref 0.450–4.500)

## 2019-10-14 LAB — VITAMIN B12: Vitamin B-12: 1224 pg/mL (ref 232–1245)

## 2019-10-14 LAB — MAGNESIUM: Magnesium: 1.9 mg/dL (ref 1.6–2.3)

## 2019-10-14 LAB — T4, FREE: Free T4: 2.05 ng/dL — ABNORMAL HIGH (ref 0.82–1.77)

## 2019-10-14 LAB — ALT: ALT: 8 IU/L (ref 0–44)

## 2019-10-14 MED ORDER — LEVOTHYROXINE SODIUM 137 MCG PO TABS: 137.0000 ug | ORAL_TABLET | Freq: Every day | ORAL | 1 refills | Status: AC

## 2019-12-03 ENCOUNTER — Other Ambulatory Visit: Payer: Medicare Other

## 2019-12-03 ENCOUNTER — Other Ambulatory Visit: Payer: Self-pay

## 2019-12-03 ENCOUNTER — Encounter: Payer: Self-pay | Admitting: Internal Medicine

## 2019-12-03 ENCOUNTER — Ambulatory Visit (INDEPENDENT_AMBULATORY_CARE_PROVIDER_SITE_OTHER): Payer: Medicare Other | Admitting: Internal Medicine

## 2019-12-03 VITALS — BP 114/76 | HR 51 | Temp 98.1°F | Ht 67.5 in | Wt 165.4 lb

## 2019-12-03 DIAGNOSIS — E039 Hypothyroidism, unspecified: Secondary | ICD-10-CM | POA: Diagnosis not present

## 2019-12-03 DIAGNOSIS — I700 Atherosclerosis of aorta: Secondary | ICD-10-CM

## 2019-12-03 DIAGNOSIS — Z23 Encounter for immunization: Secondary | ICD-10-CM | POA: Diagnosis not present

## 2019-12-03 NOTE — Progress Notes (Signed)
I,Scott Hinton,acting as a Education administrator for Scott Greenland, MD.,have documented all relevant documentation on the behalf of Scott Greenland, MD,as directed by  Scott Greenland, MD while in the presence of Scott Greenland, MD.  This visit occurred during the SARS-CoV-2 public health emergency.  Safety protocols were in place, including screening questions prior to the visit, additional usage of staff PPE, and extensive cleaning of exam room while observing appropriate contact time as indicated for disinfecting solutions.  Subjective:     Patient ID: Scott Hinton , male    DOB: [birth year] , 69 y.o.   MRN: [id number]   Chief Complaint  Patient presents with  . Hypothyroidism    HPI  He presents today for thyroid check. He reports he feels well on current dose. He is currently taking levothyroxine 111mcg daily. He denies palpitations and dizziness.   Thyroid Problem Presents for follow-up visit. Patient reports no cold intolerance, constipation or depressed mood. The symptoms have been stable.     Past Medical History:  Diagnosis Date  . Hypercholesterolemia   . Sleep apnea with use of continuous positive airway pressure (CPAP) 2010  . Thyroid disease      Family History  Problem Relation Age of Onset  . Liver cancer Mother   . Atrial fibrillation Mother   . Heart disease Father   . Emphysema Father   . Coronary artery disease Father   . Stroke Sister        2  . Atrial fibrillation Sister   . Pneumonia Brother   . Stroke Brother   . Healthy Brother   . Stroke Brother   . Heart disease Sister   . Epilepsy Brother   . Pneumonia Brother      Current Outpatient Medications:  .  aspirin 81 MG chewable tablet, Chew 81 mg by mouth daily., Disp: , Rfl:  .  atorvastatin (LIPITOR) 10 MG tablet, TAKE 1 TABLET BY MOUTH EVERY DAY, Disp: 90 tablet, Rfl: 1 .  cholecalciferol (VITAMIN D) 1000 units tablet, Take 2,000 Units by mouth daily., Disp: , Rfl:  .  fenofibrate (TRICOR) 48 MG  tablet, TAKE 1 TABLET(48 MG) BY MOUTH DAILY, Disp: 90 tablet, Rfl: 2 .  fexofenadine (ALLEGRA) 180 MG tablet, Take 180 mg by mouth daily., Disp: , Rfl:  .  levothyroxine (SYNTHROID) 137 MCG tablet, Take 1 tablet (137 mcg total) by mouth daily before breakfast., Disp: 90 tablet, Rfl: 1 .  meclizine (ANTIVERT) 25 MG tablet, Take 1 tablet (25 mg total) by mouth 3 (three) times daily as needed for dizziness (mild dizziness)., Disp: 30 tablet, Rfl: 1 .  Multiple Vitamin (MULTIVITAMIN) tablet, Take 1 tablet by mouth daily. ISOTONIX POWDER MIX : Multivitamin, opc-3, Calcium plus, Activated B-Complex, Disp: , Rfl:  .  pantoprazole (PROTONIX) 40 MG tablet, TAKE 1 TABLET(40 MG) BY MOUTH DAILY, Disp: 90 tablet, Rfl: 1 .  prednisoLONE acetate (PRED FORTE) 1 % ophthalmic suspension, SMARTSIG:4 Drop(s) In Ear(s) Twice Daily PRN, Disp: , Rfl:    No Known Allergies   Review of Systems  Constitutional: Negative.   Respiratory: Negative.   Cardiovascular: Negative.   Gastrointestinal: Negative.  Negative for constipation.  Endocrine: Negative for cold intolerance.  Psychiatric/Behavioral: Negative.   All other systems reviewed and are negative.    Today's Vitals   12/03/19 1207  BP: 114/76  Pulse: (!) 51  Temp: 98.1 F (36.7 C)  TempSrc: Oral  Weight: 165 lb 6.4 oz (75 kg)  Height: 5' 7.5" (  1.715 m)  PainSc: 0-No pain   Body mass index is 25.52 kg/m.  Wt Readings from Last 3 Encounters:  12/03/19 165 lb 6.4 oz (75 kg)  10/13/19 166 lb 3.2 oz (75.4 kg)  08/03/19 165 lb (74.8 kg)   Objective:  Physical Exam Vitals and nursing note reviewed.  Constitutional:      Appearance: Normal appearance.  HENT:     Head: Normocephalic and atraumatic.  Cardiovascular:     Rate and Rhythm: Normal rate and regular rhythm.     Heart sounds: Normal heart sounds.  Pulmonary:     Breath sounds: Normal breath sounds.  Skin:    General: Skin is warm.  Neurological:     General: No focal deficit present.      Mental Status: He is alert and oriented to person, place, and time.         Assessment And Plan:     1. Primary hypothyroidism Comments: I will check TSH and adjust meds as needed.  - TSH  2. Aortic atherosclerosis ([place]) Comments: CT angio from April 2021 reviewed in full detail. He is encouraged to take cholesterol meds as prescribed and to follow a heart healthy diet.   3. Need for vaccination Comments: He was given high dose flu vaccine.  - Flu Vaccine QUAD High Dose(Fluad)     Patient was given opportunity to ask questions. Patient verbalized understanding of the plan and was able to repeat key elements of the plan. All questions were answered to their satisfaction.  Scott Greenland, MD   I, Scott Greenland, MD, have reviewed all documentation for this visit. The documentation on 12/05/19 for the exam, diagnosis, procedures, and orders are all accurate and complete.  THE PATIENT IS ENCOURAGED TO PRACTICE SOCIAL DISTANCING DUE TO THE COVID-19 PANDEMIC.

## 2019-12-03 NOTE — Patient Instructions (Signed)

## 2019-12-04 LAB — TSH: TSH: 0.341 u[IU]/mL — ABNORMAL LOW (ref 0.450–4.500)

## 2019-12-08 ENCOUNTER — Other Ambulatory Visit: Payer: Self-pay

## 2019-12-08 ENCOUNTER — Encounter: Payer: Self-pay | Admitting: Internal Medicine

## 2019-12-08 DIAGNOSIS — H6123 Impacted cerumen, bilateral: Secondary | ICD-10-CM | POA: Diagnosis not present

## 2019-12-08 MED ORDER — LEVOTHYROXINE SODIUM 125 MCG PO TABS
125.0000 ug | ORAL_TABLET | Freq: Every day | ORAL | 1 refills | Status: DC
Start: 2019-12-08 — End: 2020-05-16

## 2020-01-19 ENCOUNTER — Other Ambulatory Visit: Payer: Self-pay

## 2020-01-19 ENCOUNTER — Other Ambulatory Visit: Payer: Medicare Other

## 2020-01-19 DIAGNOSIS — E039 Hypothyroidism, unspecified: Secondary | ICD-10-CM | POA: Diagnosis not present

## 2020-01-20 LAB — TSH: TSH: 3.34 u[IU]/mL (ref 0.450–4.500)

## 2020-02-03 ENCOUNTER — Encounter: Payer: Self-pay | Admitting: Internal Medicine

## 2020-02-27 ENCOUNTER — Encounter: Payer: Self-pay | Admitting: Internal Medicine

## 2020-03-04 ENCOUNTER — Ambulatory Visit (INDEPENDENT_AMBULATORY_CARE_PROVIDER_SITE_OTHER): Payer: Medicare Other

## 2020-03-04 VITALS — BP 130/72 | HR 54

## 2020-03-04 DIAGNOSIS — Z23 Encounter for immunization: Secondary | ICD-10-CM

## 2020-03-04 NOTE — Progress Notes (Signed)
   Covid-[facility] Vaccination Clinic  Name:  Dion Sibal    MRN: [id number] DOB: 12-20-1950  03/04/2020  Mr. Tamargo was observed post Covid-19 immunization for more than 30 minutes. During the observation period, he experienced an adverse reaction with the following symptoms:  Shaking and nausea Assessment : 11:00 Actions taken: Vitals was taken, EMS was called he was accessed and decided he wanted to wait on his wife to pick him up  Medications administered: Diphenhydramine oral 12.5 mg  Disposition:shaking and nausea    Immunizations Administered    Name Date Dose VIS Date Route   Moderna Covid-19 Booster Vaccine 03/04/2020 11:06 AM 0.25 mL 12/16/2019 Intramuscular   Manufacturer: Moderna   Lot: [id number]   [place]: [id number]

## 2020-03-19 ENCOUNTER — Other Ambulatory Visit: Payer: Self-pay | Admitting: Internal Medicine

## 2020-04-10 ENCOUNTER — Other Ambulatory Visit: Payer: Self-pay | Admitting: Internal Medicine

## 2020-04-14 ENCOUNTER — Ambulatory Visit (INDEPENDENT_AMBULATORY_CARE_PROVIDER_SITE_OTHER): Payer: Medicare Other | Admitting: Internal Medicine

## 2020-04-14 ENCOUNTER — Encounter: Payer: Self-pay | Admitting: Internal Medicine

## 2020-04-14 ENCOUNTER — Ambulatory Visit (INDEPENDENT_AMBULATORY_CARE_PROVIDER_SITE_OTHER): Payer: Medicare Other

## 2020-04-14 ENCOUNTER — Other Ambulatory Visit: Payer: Self-pay

## 2020-04-14 VITALS — BP 112/62 | HR 68 | Temp 97.4°F | Ht 68.0 in | Wt 178.4 lb

## 2020-04-14 VITALS — BP 112/62 | HR 48 | Temp 97.4°F | Ht 68.0 in | Wt 178.4 lb

## 2020-04-14 DIAGNOSIS — E782 Mixed hyperlipidemia: Secondary | ICD-10-CM

## 2020-04-14 DIAGNOSIS — R351 Nocturia: Secondary | ICD-10-CM

## 2020-04-14 DIAGNOSIS — Z9189 Other specified personal risk factors, not elsewhere classified: Secondary | ICD-10-CM | POA: Diagnosis not present

## 2020-04-14 DIAGNOSIS — I700 Atherosclerosis of aorta: Secondary | ICD-10-CM | POA: Diagnosis not present

## 2020-04-14 DIAGNOSIS — E039 Hypothyroidism, unspecified: Secondary | ICD-10-CM

## 2020-04-14 DIAGNOSIS — R6882 Decreased libido: Secondary | ICD-10-CM | POA: Diagnosis not present

## 2020-04-14 DIAGNOSIS — M25511 Pain in right shoulder: Secondary | ICD-10-CM | POA: Diagnosis not present

## 2020-04-14 DIAGNOSIS — Z0000 Encounter for general adult medical examination without abnormal findings: Secondary | ICD-10-CM | POA: Diagnosis not present

## 2020-04-14 DIAGNOSIS — G8929 Other chronic pain: Secondary | ICD-10-CM | POA: Diagnosis not present

## 2020-04-14 NOTE — Patient Instructions (Signed)
Scott Hinton , Thank you for taking time to come for your Medicare Wellness Visit. I appreciate your ongoing commitment to your health goals. Please review the following plan we discussed and let me know if I can assist you in the future.   Screening recommendations/referrals: Colonoscopy: completed 07/30/2012 Recommended yearly ophthalmology/optometry visit for glaucoma screening and checkup Recommended yearly dental visit for hygiene and checkup  Vaccinations: Influenza vaccine: completed 12/03/2019, due 09/26/2020 Pneumococcal vaccine: completed 04/05/2018 Tdap vaccine: completed 11/19/2013, due 11/20/2023 Shingles vaccine: discussed   Covid-19:  03/04/2020, 05/09/2019, 04/11/2019  Advanced directives: Advance directive discussed with you today. Even though you declined this today please call our office should you change your mind and we can give you the proper paperwork for you to fill out.  Conditions/risks identified: none  Next appointment: Follow up in one year for your annual wellness visit.   Preventive Care 69 Years and Older, Male Preventive care refers to lifestyle choices and visits with your health care provider that can promote health and wellness. What does preventive care include?  A yearly physical exam. This is also called an annual well check.  Dental exams once or twice a year.  Routine eye exams. Ask your health care provider how often you should have your eyes checked.  Personal lifestyle choices, including:  Daily care of your teeth and gums.  Regular physical activity.  Eating a healthy diet.  Avoiding tobacco and drug use.  Limiting alcohol use.  Practicing safe sex.  Taking low doses of aspirin every day.  Taking vitamin and mineral supplements as recommended by your health care provider. What happens during an annual well check? The services and screenings done by your health care provider during your annual well check will depend on your age, overall  health, lifestyle risk factors, and family history of disease. Counseling  Your health care provider may ask you questions about your:  Alcohol use.  Tobacco use.  Drug use.  Emotional well-being.  Home and relationship well-being.  Sexual activity.  Eating habits.  History of falls.  Memory and ability to understand (cognition).  Work and work Statistician. Screening  You may have the following tests or measurements:  Height, weight, and BMI.  Blood pressure.  Lipid and cholesterol levels. These may be checked every 5 years, or more frequently if you are over 94 years old.  Skin check.  Lung cancer screening. You may have this screening every year starting at age 57 if you have a 30-pack-year history of smoking and currently smoke or have quit within the past 15 years.  Fecal occult blood test (FOBT) of the stool. You may have this test every year starting at age 7.  Flexible sigmoidoscopy or colonoscopy. You may have a sigmoidoscopy every 5 years or a colonoscopy every 10 years starting at age 26.  Prostate cancer screening. Recommendations will vary depending on your family history and other risks.  Hepatitis C blood test.  Hepatitis B blood test.  Sexually transmitted disease (STD) testing.  Diabetes screening. This is done by checking your blood sugar (glucose) after you have not eaten for a while (fasting). You may have this done every 1-3 years.  Abdominal aortic aneurysm (AAA) screening. You may need this if you are a current or former smoker.  Osteoporosis. You may be screened starting at age 28 if you are at high risk. Talk with your health care provider about your test results, treatment options, and if necessary, the need for more tests.  Vaccines  Your health care provider may recommend certain vaccines, such as:  Influenza vaccine. This is recommended every year.  Tetanus, diphtheria, and acellular pertussis (Tdap, Td) vaccine. You may need a Td  booster every 10 years.  Zoster vaccine. You may need this after age 23.  Pneumococcal 13-valent conjugate (PCV13) vaccine. One dose is recommended after age 35.  Pneumococcal polysaccharide (PPSV23) vaccine. One dose is recommended after age 87. Talk to your health care provider about which screenings and vaccines you need and how often you need them. This information is not intended to replace advice given to you by your health care provider. Make sure you discuss any questions you have with your health care provider. Document Released: 03/11/2015 Document Revised: 11/02/2015 Document Reviewed: 12/14/2014 Elsevier Interactive Patient Education  2017 [employer] Prevention in the Home Falls can cause injuries. They can happen to people of all ages. There are many things you can do to make your home safe and to help prevent falls. What can I do on the outside of my home?  Regularly fix the edges of walkways and driveways and fix any cracks.  Remove anything that might make you trip as you walk through a door, such as a raised step or threshold.  Trim any bushes or trees on the path to your home.  Use bright outdoor lighting.  Clear any walking paths of anything that might make someone trip, such as rocks or tools.  Regularly check to see if handrails are loose or broken. Make sure that both sides of any steps have handrails.  Any raised decks and porches should have guardrails on the edges.  Have any leaves, snow, or ice cleared regularly.  Use sand or salt on walking paths during winter.  Clean up any spills in your garage right away. This includes oil or grease spills. What can I do in the bathroom?  Use night lights.  Install grab bars by the toilet and in the tub and shower. Do not use towel bars as grab bars.  Use non-skid mats or decals in the tub or shower.  If you need to sit down in the shower, use a plastic, non-slip stool.  Keep the floor dry. Clean up  any water that spills on the floor as soon as it happens.  Remove soap buildup in the tub or shower regularly.  Attach bath mats securely with double-sided non-slip rug tape.  Do not have throw rugs and other things on the floor that can make you trip. What can I do in the bedroom?  Use night lights.  Make sure that you have a light by your bed that is easy to reach.  Do not use any sheets or blankets that are too big for your bed. They should not hang down onto the floor.  Have a firm chair that has side arms. You can use this for support while you get dressed.  Do not have throw rugs and other things on the floor that can make you trip. What can I do in the kitchen?  Clean up any spills right away.  Avoid walking on wet floors.  Keep items that you use a lot in easy-to-reach places.  If you need to reach something above you, use a strong step stool that has a grab bar.  Keep electrical cords out of the way.  Do not use floor polish or wax that makes floors slippery. If you must use wax, use non-skid floor wax.  Do not have throw rugs and other things on the floor that can make you trip. What can I do with my stairs?  Do not leave any items on the stairs.  Make sure that there are handrails on both sides of the stairs and use them. Fix handrails that are broken or loose. Make sure that handrails are as long as the stairways.  Check any carpeting to make sure that it is firmly attached to the stairs. Fix any carpet that is loose or worn.  Avoid having throw rugs at the top or bottom of the stairs. If you do have throw rugs, attach them to the floor with carpet tape.  Make sure that you have a light switch at the top of the stairs and the bottom of the stairs. If you do not have them, ask someone to add them for you. What else can I do to help prevent falls?  Wear shoes that:  Do not have high heels.  Have rubber bottoms.  Are comfortable and fit you well.  Are  closed at the toe. Do not wear sandals.  If you use a stepladder:  Make sure that it is fully opened. Do not climb a closed stepladder.  Make sure that both sides of the stepladder are locked into place.  Ask someone to hold it for you, if possible.  Clearly mark and make sure that you can see:  Any grab bars or handrails.  First and last steps.  Where the edge of each step is.  Use tools that help you move around (mobility aids) if they are needed. These include:  Canes.  Walkers.  Scooters.  Crutches.  Turn on the lights when you go into a dark area. Replace any light bulbs as soon as they burn out.  Set up your furniture so you have a clear path. Avoid moving your furniture around.  If any of your floors are uneven, fix them.  If there are any pets around you, be aware of where they are.  Review your medicines with your doctor. Some medicines can make you feel dizzy. This can increase your chance of falling. Ask your doctor what other things that you can do to help prevent falls. This information is not intended to replace advice given to you by your health care provider. Make sure you discuss any questions you have with your health care provider. Document Released: 12/09/2008 Document Revised: 07/21/2015 Document Reviewed: 03/19/2014 Elsevier Interactive Patient Education  2017 [employer].

## 2020-04-14 NOTE — Patient Instructions (Signed)
Hypothyroidism  Hypothyroidism is when the thyroid gland does not make enough of certain hormones (it is underactive). The thyroid gland is a small gland located in the lower front part of the neck, just in front of the windpipe (trachea). This gland makes hormones that help control how the body uses food for energy (metabolism) as well as how the heart and brain function. These hormones also play a role in keeping your bones strong. When the thyroid is underactive, it produces too little of the hormones thyroxine (T4) and triiodothyronine (T3). What are the causes? This condition may be caused by:  Hashimoto's disease. This is a disease in which the body's disease-fighting system (immune system) attacks the thyroid gland. This is the most common cause.  Viral infections.  Pregnancy.  Certain medicines.  Birth defects.  Past radiation treatments to the head or neck for cancer.  Past treatment with radioactive iodine.  Past exposure to radiation in the environment.  Past surgical removal of part or all of the thyroid.  Problems with a gland in the center of the brain (pituitary gland).  Lack of enough iodine in the diet. What increases the risk? You are more likely to develop this condition if:  You are male.  You have a family history of thyroid conditions.  You use a medicine called lithium.  You take medicines that affect the immune system (immunosuppressants). What are the signs or symptoms? Symptoms of this condition include:  Feeling as though you have no energy (lethargy).  Not being able to tolerate cold.  Weight gain that is not explained by a change in diet or exercise habits.  Lack of appetite.  Dry skin.  Coarse hair.  Menstrual irregularity.  Slowing of thought processes.  Constipation.  Sadness or depression. How is this diagnosed? This condition may be diagnosed based on:  Your symptoms, your medical history, and a physical exam.  Blood  tests. You may also have imaging tests, such as an ultrasound or MRI. How is this treated? This condition is treated with medicine that replaces the thyroid hormones that your body does not make. After you begin treatment, it may take several weeks for symptoms to go away. Follow these instructions at home:  Take over-the-counter and prescription medicines only as told by your health care provider.  If you start taking any new medicines, tell your health care provider.  Keep all follow-up visits as told by your health care provider. This is important. ? As your condition improves, your dosage of thyroid hormone medicine may change. ? You will need to have blood tests regularly so that your health care provider can monitor your condition. Contact a health care provider if:  Your symptoms do not get better with treatment.  You are taking thyroid hormone replacement medicine and you: ? Sweat a lot. ? Have tremors. ? Feel anxious. ? Lose weight rapidly. ? Cannot tolerate heat. ? Have emotional swings. ? Have diarrhea. ? Feel weak. Get help right away if you have:  Chest pain.  An irregular heartbeat.  A rapid heartbeat.  Difficulty breathing. Summary  Hypothyroidism is when the thyroid gland does not make enough of certain hormones (it is underactive).  When the thyroid is underactive, it produces too little of the hormones thyroxine (T4) and triiodothyronine (T3).  The most common cause is Hashimoto's disease, a disease in which the body's disease-fighting system (immune system) attacks the thyroid gland. The condition can also be caused by viral infections, medicine, pregnancy, or   past radiation treatment to the head or neck.  Symptoms may include weight gain, dry skin, constipation, feeling as though you do not have energy, and not being able to tolerate cold.  This condition is treated with medicine to replace the thyroid hormones that your body does not make. This  information is not intended to replace advice given to you by your health care provider. Make sure you discuss any questions you have with your health care provider. Document Revised: 11/13/2019 Document Reviewed: 10/29/2019 Elsevier Patient Education  2021 Elsevier Inc.  

## 2020-04-14 NOTE — Progress Notes (Signed)
This visit occurred during the SARS-CoV-2 public health emergency.  Safety protocols were in place, including screening questions prior to the visit, additional usage of staff PPE, and extensive cleaning of exam room while observing appropriate contact time as indicated for disinfecting solutions.  Subjective:   Erinn Huskins is a 70 y.o. male who presents for Medicare Annual/Subsequent preventive examination.  Review of Systems     Cardiac Risk Factors include: advanced age (>79men, >55 women);male gender     Objective:    Today's Vitals   04/14/20 1028  BP: 112/62  Pulse: (!) 48  Temp: (!) 97.4 F (36.3 C)  TempSrc: Oral  SpO2: 98%  Weight: 178 lb 6.4 oz (80.9 kg)  Height: 5\' 8"  (1.727 m)   Body mass index is 27.13 kg/m.  Advanced Directives 04/14/2020 07/21/2019 07/18/2019 06/11/2019 04/09/2019 03/26/2018 01/27/2017  Does Patient Have a Medical Advance Directive? No No No No No No No  Would patient like information on creating a medical advance directive? No - Patient declined No - Patient declined - - No - Patient declined - No - Patient declined    Current Medications (verified) Outpatient Encounter Medications as of 04/14/2020  Medication Sig  . aspirin 81 MG chewable tablet Chew 81 mg by mouth daily.  Marland Kitchen atorvastatin (LIPITOR) 10 MG tablet TAKE 1 TABLET BY MOUTH EVERY DAY  . cholecalciferol (VITAMIN D) 1000 units tablet Take 2,000 Units by mouth daily.  . fenofibrate (TRICOR) 48 MG tablet TAKE 1 TABLET(48 MG) BY MOUTH DAILY  . fexofenadine (ALLEGRA) 180 MG tablet Take 180 mg by mouth daily.  Marland Kitchen levothyroxine (SYNTHROID) 125 MCG tablet Take 1 tablet (125 mcg total) by mouth daily.  . meclizine (ANTIVERT) 25 MG tablet TAKE 1 TABLET BY MOUTH THREE TIMES DAILY AS NEEDED FOR MILD DIZZINESS  . Multiple Vitamin (MULTIVITAMIN) tablet Take 1 tablet by mouth daily. ISOTONIX POWDER MIX : Multivitamin, opc-3, Calcium plus, Activated B-Complex  . pantoprazole (PROTONIX) 40 MG tablet TAKE  1 TABLET(40 MG) BY MOUTH DAILY  . prednisoLONE acetate (PRED FORTE) 1 % ophthalmic suspension SMARTSIG:4 Drop(s) In Ear(s) Twice Daily PRN  . levothyroxine (SYNTHROID) 137 MCG tablet Take 1 tablet (137 mcg total) by mouth daily before breakfast. (Patient not taking: Reported on 04/14/2020)   No facility-administered encounter medications on file as of 04/14/2020.    Allergies (verified) Patient has no known allergies.   History: Past Medical History:  Diagnosis Date  . Hypercholesterolemia   . Sleep apnea with use of continuous positive airway pressure (CPAP) 2010  . Thyroid disease    Past Surgical History:  Procedure Laterality Date  . NASAL POLYP SURGERY  1984   Family History  Problem Relation Age of Onset  . Liver cancer Mother   . Atrial fibrillation Mother   . Heart disease Father   . Emphysema Father   . Coronary artery disease Father   . Stroke Sister        2  . Atrial fibrillation Sister   . Pneumonia Brother   . Stroke Brother   . Healthy Brother   . Stroke Brother   . Heart disease Sister   . Epilepsy Brother   . Pneumonia Brother    Social History   Socioeconomic History  . Marital status: Married    Spouse name: Not on file  . Number of children: Not on file  . Years of education: Not on file  . Highest education level: Not on file  Occupational History  . Occupation:  retired  Tobacco Use  . Smoking status: Never Smoker  . Smokeless tobacco: Never Used  Vaping Use  . Vaping Use: Never used  Substance and Sexual Activity  . Alcohol use: No  . Drug use: No  . Sexual activity: Not Currently  Other Topics Concern  . Not on file  Social History Narrative  . Not on file   Social Determinants of Health   Financial Resource Strain: Low Risk   . Difficulty of Paying Living Expenses: Not hard at all  Food Insecurity: No Food Insecurity  . Worried About Charity fundraiser in the Last Year: Never true  . Ran Out of Food in the Last Year: Never  true  Transportation Needs: No Transportation Needs  . Lack of Transportation (Medical): No  . Lack of Transportation (Non-Medical): No  Physical Activity: Sufficiently Active  . Days of Exercise per Week: 7 days  . Minutes of Exercise per Session: 60 min  Stress: No Stress Concern Present  . Feeling of Stress : Not at all  Social Connections: Not on file    Tobacco Counseling Counseling given: Not Answered   Clinical Intake:  Pre-visit preparation completed: Yes  Pain : No/denies pain     Nutritional Status: BMI 25 -29 Overweight Nutritional Risks: None Diabetes: No  How often do you need to have someone help you when you read instructions, pamphlets, or other written materials from your doctor or pharmacy?: 1 - Never What is the last grade level you completed in school?: 27yrs college  Diabetic? no  Interpreter Needed?: No  Information entered by :: NAllen LPN   Activities of Daily Living In your present state of health, do you have any difficulty performing the following activities: 04/14/2020  Hearing? Y  Comment has hearing aides  Vision? N  Difficulty concentrating or making decisions? N  Walking or climbing stairs? N  Dressing or bathing? N  Doing errands, shopping? N  Preparing Food and eating ? N  Using the Toilet? N  In the past six months, have you accidently leaked urine? N  Do you have problems with loss of bowel control? N  Managing your Medications? N  Managing your Finances? N  Housekeeping or managing your Housekeeping? N  Some recent data might be hidden    Patient Care Team: Glendale Chard, MD as PCP - General (Internal Medicine)  Indicate any recent Medical Services you may have received from other than [facility] providers in the past year (date may be approximate).     Assessment:   This is a routine wellness examination for [place].  Hearing/Vision screen No exam data present  Dietary issues and exercise activities discussed: Current  Exercise Habits: Home exercise routine, Type of exercise: walking, Time (Minutes): 60, Frequency (Times/Week): 7, Weekly Exercise (Minutes/Week): 420  Goals    .  Patient Stated (pt-stated)      Continue exercising with total gym and walking. Keep bowling and when weather gets good start golfing.    .  Patient Stated      04/09/2019, to be a better person    .  Patient Stated      04/14/2020, do more weight training      Depression Screen PHQ 2/9 Scores 04/14/2020 06/16/2019 04/09/2019 03/26/2018  PHQ - 2 Score 0 0 0 0  PHQ- 9 Score - - - 0    Fall Risk Fall Risk  04/14/2020 06/16/2019 04/09/2019 09/24/2018 03/26/2018  Falls in the past year? 0 0 0  0 0  Number falls in past yr: - 0 - - -  Risk for fall due to : Medication side effect - - - Medication side effect  Follow up Falls evaluation completed;Education provided;Falls prevention discussed - Falls evaluation completed;Education provided;Falls prevention discussed - -    FALL RISK PREVENTION PERTAINING TO THE HOME:  Any stairs in or around the home? Yes  If so, are there any without handrails? No  Home free of loose throw rugs in walkways, pet beds, electrical cords, etc? Yes  Adequate lighting in your home to reduce risk of falls? Yes   ASSISTIVE DEVICES UTILIZED TO PREVENT FALLS:  Life alert? No  Use of a cane, walker or w/c? No  Grab bars in the bathroom? No  Shower chair or bench in shower? Yes  Elevated toilet seat or a handicapped toilet? Yes   TIMED UP AND GO:  Was the test performed? No .   Gait steady and fast without use of assistive device  Cognitive Function:     6CIT Screen 04/14/2020 04/09/2019 03/26/2018  What Year? 0 points 0 points 0 points  What month? 0 points 0 points 0 points  What time? 0 points 0 points 0 points  Count back from 20 0 points 0 points 0 points  Months in reverse 0 points 0 points 0 points  Repeat phrase 2 points 4 points 0 points  Total Score 2 4 0    Immunizations Immunization  History  Administered Date(s) Administered  . Fluad Quad(high Dose 65+) 12/03/2019  . Influenza, High Dose Seasonal PF 11/17/2017  . Moderna SARS-COV2 Booster Vaccination 03/04/2020  . Moderna Sars-Covid-2 Vaccination 04/11/2019, 05/09/2019  . Pneumococcal Conjugate-13 04/05/2018  . Pneumococcal Polysaccharide-23 01/10/2015    TDAP status: Up to date  Flu Vaccine status: Up to date  Pneumococcal vaccine status: Up to date  Covid-19 vaccine status: Completed vaccines  Qualifies for Shingles Vaccine? Yes   Zostavax completed No   Shingrix Completed?: No.    Education has been provided regarding the importance of this vaccine. Patient has been advised to call insurance company to determine out of pocket expense if they have not yet received this vaccine. Advised may also receive vaccine at local pharmacy or Health Dept. Verbalized acceptance and understanding.  Screening Tests Health Maintenance  Topic Date Due  . COLONOSCOPY (Pts 45-63yrs Insurance coverage will need to be confirmed)  07/31/2022  . TETANUS/TDAP  11/20/2023  . INFLUENZA VACCINE  Completed  . COVID-19 Vaccine  Completed  . Hepatitis C Screening  Completed  . PNA vac Low Risk Adult  Completed    Health Maintenance  There are no preventive care reminders to display for this patient.  Colorectal cancer screening: Type of screening: Colonoscopy. Completed 07/30/2012. Repeat every 10 years  Lung Cancer Screening: (Low Dose CT Chest recommended if Age 42-80 years, 30 pack-year currently smoking OR have quit w/in 15years.) does not qualify.   Lung Cancer Screening Referral: no  Additional Screening:  Hepatitis C Screening: does qualify; Completed 05/07/2012  Vision Screening: Recommended annual ophthalmology exams for early detection of glaucoma and other disorders of the eye. Is the patient up to date with their annual eye exam?  No  Who is the provider or what is the name of the office in which the patient  attends annual eye exams? [place] If pt is not established with a provider, would they like to be referred to a provider to establish care? No .   Dental Screening:  Recommended annual dental exams for proper oral hygiene  Community Resource Referral / Chronic Care Management: CRR required this visit?  No   CCM required this visit?  No      Plan:     I have personally reviewed and noted the following in the patient's chart:   . Medical and social history . Use of alcohol, tobacco or illicit drugs  . Current medications and supplements . Functional ability and status . Nutritional status . Physical activity . Advanced directives . List of other physicians . Hospitalizations, surgeries, and ER visits in previous 12 months . Vitals . Screenings to include cognitive, depression, and falls . Referrals and appointments  In addition, I have reviewed and discussed with patient certain preventive protocols, quality metrics, and best practice recommendations. A written personalized care plan for preventive services as well as general preventive health recommendations were provided to patient.     Kellie Simmering, LPN   11/11/3844   Nurse Notes:

## 2020-04-14 NOTE — Progress Notes (Signed)
I,Tianna Badgett,acting as a Education administrator for [name], MD.,have documented all relevant documentation on the behalf of [name], MD,as directed by  [name], MD while in the presence of [name], MD.  This visit occurred during the SARS-CoV-2 public health emergency.  Safety protocols were in place, including screening questions prior to the visit, additional usage of staff PPE, and extensive cleaning of exam room while observing appropriate contact time as indicated for disinfecting solutions.  Subjective:     Patient ID: Scott Hinton , male    DOB: 09-25-1950 , 70 y.o.   MRN: [id number]   Chief Complaint  Patient presents with  . Hypothyroidism    HPI  He presents today for thyroid check and chol check. He reports compliance with medications. He reports that he and his wife are moving to [place] next month to be closer to family. He states she is ready to move now that she is retired.   Thyroid Problem Presents for follow-up visit. Patient reports no cold intolerance, constipation or depressed mood. The symptoms have been stable.     Past Medical History:  Diagnosis Date  . Hypercholesterolemia   . Sleep apnea with use of continuous positive airway pressure (CPAP) 2010  . Thyroid disease      Family History  Problem Relation Age of Onset  . Liver cancer Mother   . Atrial fibrillation Mother   . Heart disease Father   . Emphysema Father   . Coronary artery disease Father   . Stroke Sister        2  . Atrial fibrillation Sister   . Pneumonia Brother   . Stroke Brother   . Healthy Brother   . Stroke Brother   . Heart disease Sister   . Epilepsy Brother   . Pneumonia Brother      Current Outpatient Medications:  .  aspirin 81 MG chewable tablet, Chew 81 mg by mouth daily., Disp: , Rfl:  .  atorvastatin (LIPITOR) 10 MG tablet, TAKE 1 TABLET BY MOUTH EVERY DAY, Disp: 90 tablet, Rfl: 1 .  cholecalciferol (VITAMIN D) 1000 units tablet, Take  2,000 Units by mouth daily., Disp: , Rfl:  .  fenofibrate (TRICOR) 48 MG tablet, TAKE 1 TABLET(48 MG) BY MOUTH DAILY, Disp: 90 tablet, Rfl: 2 .  fexofenadine (ALLEGRA) 180 MG tablet, Take 180 mg by mouth daily., Disp: , Rfl:  .  levothyroxine (SYNTHROID) 125 MCG tablet, Take 1 tablet (125 mcg total) by mouth daily., Disp: 90 tablet, Rfl: 1 .  levothyroxine (SYNTHROID) 137 MCG tablet, Take 1 tablet (137 mcg total) by mouth daily before breakfast. (Patient not taking: Reported on 04/14/2020), Disp: 90 tablet, Rfl: 1 .  meclizine (ANTIVERT) 25 MG tablet, TAKE 1 TABLET BY MOUTH THREE TIMES DAILY AS NEEDED FOR MILD DIZZINESS, Disp: 30 tablet, Rfl: 1 .  Multiple Vitamin (MULTIVITAMIN) tablet, Take 1 tablet by mouth daily. ISOTONIX POWDER MIX : Multivitamin, opc-3, Calcium plus, Activated B-Complex, Disp: , Rfl:  .  pantoprazole (PROTONIX) 40 MG tablet, TAKE 1 TABLET(40 MG) BY MOUTH DAILY, Disp: 90 tablet, Rfl: 1 .  prednisoLONE acetate (PRED FORTE) 1 % ophthalmic suspension, SMARTSIG:4 Drop(s) In Ear(s) Twice Daily PRN, Disp: , Rfl:    No Known Allergies   Review of Systems  Constitutional: Negative.   Respiratory: Negative.   Cardiovascular: Negative.   Gastrointestinal: Negative.  Negative for constipation.  Endocrine: Negative for cold intolerance.  Genitourinary:  He c/o nocturia. He also c/o low libido. Wants to have testosterone levels checked  Musculoskeletal: Positive for arthralgias.       He c/o r shoulder pain. Denies fall/trauma. There is some pain with movements.   Neurological: Negative.      Today's Vitals   04/14/20 1047  BP: 112/62  Pulse: 68  Temp: (!) 97.4 F (36.3 C)  TempSrc: Oral  Weight: 178 lb 6.4 oz (80.9 kg)  Height: 5\' 8"  (1.727 m)  PainSc: 0-No pain   Body mass index is 27.13 kg/m.  Wt Readings from Last 3 Encounters:  04/14/20 178 lb 6.4 oz (80.9 kg)  04/14/20 178 lb 6.4 oz (80.9 kg)  12/03/19 165 lb 6.4 oz (75 kg)   Objective:  Physical  Exam Vitals and nursing note reviewed.  Constitutional:      Appearance: Normal appearance.  HENT:     Head: Normocephalic and atraumatic.     Nose:     Comments: Masked     Mouth/Throat:     Comments: Masked  Cardiovascular:     Rate and Rhythm: Normal rate and regular rhythm.     Heart sounds: Normal heart sounds.  Pulmonary:     Effort: Pulmonary effort is normal.     Breath sounds: Normal breath sounds.  Musculoskeletal:        General: Tenderness present.     Cervical back: Normal range of motion.  Skin:    General: Skin is warm.  Neurological:     General: No focal deficit present.     Mental Status: He is alert and oriented to person, place, and time.         Assessment And Plan:     1. Primary hypothyroidism Comments: I will check thyroid panel and adjust meds as needed.  - Testosterone,Free and Total - T4, free - TSH  2. Low libido Comments: He would like to have testosterone levels checked today. He is willing to take supplemnetal testosterone if needed.   3. Aortic atherosclerosis ([place]) Comments: Chronic, advised to follow heart healthy lifestyle. Advised to avoid fried foods, increase exercise and increase fier intake.   4. Mixed hyperlipidemia Comments: Chronic, importance of statin compliance was discussed with the patient.   5. Nocturia Comments: Chornic. I will check PSA today.  - PSA  6. Chronic right shoulder pain Comments: Chronic. I willl refer him to Ortho for radiographic studies and further evaluation.  - Ambulatory referral to Orthopedic Surgery  7. At risk for hypogonadism He is encouraged to initially strive for BMI less than 27 to decrease cardiac risk. He is advised to exercise no less than 150 minutes per week.     Patient was given opportunity to ask questions. Patient verbalized understanding of the plan and was able to repeat key elements of the plan. All questions were answered to their satisfaction.   I, [name], MD,  have reviewed all documentation for this visit. The documentation on 04/24/20 for the exam, diagnosis, procedures, and orders are all accurate and complete.  THE PATIENT IS ENCOURAGED TO PRACTICE SOCIAL DISTANCING DUE TO THE COVID-19 PANDEMIC.

## 2020-04-15 LAB — TESTOSTERONE,FREE AND TOTAL
Testosterone, Free: 4.2 pg/mL — ABNORMAL LOW (ref 6.6–18.1)
Testosterone: 379 ng/dL (ref 264–916)

## 2020-04-15 LAB — PSA: Prostate Specific Ag, Serum: 1.1 ng/mL (ref 0.0–4.0)

## 2020-04-15 LAB — T4, FREE: Free T4: 1.71 ng/dL (ref 0.82–1.77)

## 2020-04-15 LAB — TSH: TSH: 2.44 u[IU]/mL (ref 0.450–4.500)

## 2020-04-18 ENCOUNTER — Encounter: Payer: Self-pay | Admitting: Internal Medicine

## 2020-04-20 ENCOUNTER — Other Ambulatory Visit: Payer: Medicare Other

## 2020-04-20 ENCOUNTER — Other Ambulatory Visit: Payer: Self-pay

## 2020-04-20 DIAGNOSIS — R6882 Decreased libido: Secondary | ICD-10-CM | POA: Diagnosis not present

## 2020-04-20 DIAGNOSIS — Z9189 Other specified personal risk factors, not elsewhere classified: Secondary | ICD-10-CM | POA: Diagnosis not present

## 2020-04-21 LAB — TESTOSTERONE: Testosterone: 405 ng/dL (ref 264–916)

## 2020-04-24 DIAGNOSIS — R6882 Decreased libido: Secondary | ICD-10-CM | POA: Insufficient documentation

## 2020-04-24 DIAGNOSIS — I700 Atherosclerosis of aorta: Secondary | ICD-10-CM | POA: Insufficient documentation

## 2020-04-25 ENCOUNTER — Encounter: Payer: Self-pay | Admitting: Internal Medicine

## 2020-04-25 ENCOUNTER — Other Ambulatory Visit: Payer: Self-pay

## 2020-04-25 DIAGNOSIS — U071 COVID-19: Secondary | ICD-10-CM

## 2020-04-26 ENCOUNTER — Telehealth: Payer: Self-pay

## 2020-04-26 NOTE — Telephone Encounter (Signed)
Called to discuss with patient about COVID-19 symptoms and the use of one of the available treatments for those with mild to moderate Covid symptoms and at a high risk of hospitalization.  Pt appears to qualify for outpatient treatment due to co-morbid conditions and/or a member of an at-risk group in accordance with the FDA Emergency Use Authorization.    Symptom onset: 04/23/20 Cough, runny nose, body aches. Vaccinated: Yes Booster? Yes Immunocompromised? No Qualifiers: Yes  Pt. States he feels better, declines .  Scott Hinton

## 2020-05-14 ENCOUNTER — Other Ambulatory Visit: Payer: Self-pay | Admitting: Internal Medicine

## 2020-05-16 ENCOUNTER — Ambulatory Visit: Payer: Medicare Other | Admitting: Internal Medicine

## 2020-08-24 DIAGNOSIS — M25511 Pain in right shoulder: Secondary | ICD-10-CM | POA: Diagnosis not present

## 2020-08-24 DIAGNOSIS — E039 Hypothyroidism, unspecified: Secondary | ICD-10-CM | POA: Diagnosis not present

## 2020-08-24 DIAGNOSIS — K219 Gastro-esophageal reflux disease without esophagitis: Secondary | ICD-10-CM | POA: Diagnosis not present

## 2020-08-24 DIAGNOSIS — E785 Hyperlipidemia, unspecified: Secondary | ICD-10-CM | POA: Diagnosis not present

## 2020-09-06 DIAGNOSIS — E039 Hypothyroidism, unspecified: Secondary | ICD-10-CM | POA: Diagnosis not present

## 2020-09-06 DIAGNOSIS — Z0001 Encounter for general adult medical examination with abnormal findings: Secondary | ICD-10-CM | POA: Diagnosis not present

## 2020-09-06 DIAGNOSIS — K219 Gastro-esophageal reflux disease without esophagitis: Secondary | ICD-10-CM | POA: Diagnosis not present

## 2020-09-06 DIAGNOSIS — Z13220 Encounter for screening for lipoid disorders: Secondary | ICD-10-CM | POA: Diagnosis not present

## 2020-09-06 DIAGNOSIS — Z125 Encounter for screening for malignant neoplasm of prostate: Secondary | ICD-10-CM | POA: Diagnosis not present

## 2020-09-06 DIAGNOSIS — Z131 Encounter for screening for diabetes mellitus: Secondary | ICD-10-CM | POA: Diagnosis not present

## 2020-09-06 DIAGNOSIS — Z0000 Encounter for general adult medical examination without abnormal findings: Secondary | ICD-10-CM | POA: Diagnosis not present

## 2020-09-07 DIAGNOSIS — M25511 Pain in right shoulder: Secondary | ICD-10-CM | POA: Diagnosis not present

## 2021-03-30 ENCOUNTER — Telehealth: Payer: Self-pay | Admitting: Internal Medicine

## 2021-03-30 NOTE — Telephone Encounter (Signed)
Spoke to patient regarding his AWV appt.  He stated he moved to [place] state.  I canceled his 04/27/21 appt with provider.  I cannot removed PCP from chart.  Misti can you help me with this? Thank you

## 2021-04-04 ENCOUNTER — Other Ambulatory Visit: Payer: Self-pay | Admitting: Internal Medicine

## 2021-04-27 ENCOUNTER — Ambulatory Visit: Payer: Medicare Other | Admitting: Internal Medicine

## 2021-04-27 ENCOUNTER — Ambulatory Visit: Payer: Medicare Other
